# Patient Record
Sex: Female | Born: 1975 | Hispanic: Yes | Marital: Married | Smoking: Current every day smoker
Health system: Southern US, Community
[De-identification: ages and names within clinical notes are randomized; demographics above are authoritative.]

## PROBLEM LIST (undated history)

## (undated) DIAGNOSIS — F32A Depression, unspecified: Secondary | ICD-10-CM

## (undated) DIAGNOSIS — N920 Excessive and frequent menstruation with regular cycle: Secondary | ICD-10-CM

## (undated) DIAGNOSIS — K5790 Diverticulosis of intestine, part unspecified, without perforation or abscess without bleeding: Secondary | ICD-10-CM

## (undated) DIAGNOSIS — K8020 Calculus of gallbladder without cholecystitis without obstruction: Secondary | ICD-10-CM

## (undated) DIAGNOSIS — K219 Gastro-esophageal reflux disease without esophagitis: Secondary | ICD-10-CM

## (undated) DIAGNOSIS — R911 Solitary pulmonary nodule: Secondary | ICD-10-CM

## (undated) DIAGNOSIS — F17200 Nicotine dependence, unspecified, uncomplicated: Secondary | ICD-10-CM

## (undated) DIAGNOSIS — N840 Polyp of corpus uteri: Secondary | ICD-10-CM

## (undated) DIAGNOSIS — J45909 Unspecified asthma, uncomplicated: Secondary | ICD-10-CM

## (undated) HISTORY — PX: OTHER SURGICAL HISTORY: SHX169

## (undated) HISTORY — DX: Unspecified asthma, uncomplicated: J45.909

## (undated) HISTORY — DX: Diverticulosis of intestine, part unspecified, without perforation or abscess without bleeding: K57.90

## (undated) HISTORY — DX: Nicotine dependence, unspecified, uncomplicated: F17.200

## (undated) HISTORY — DX: Calculus of gallbladder without cholecystitis without obstruction: K80.20

## (undated) HISTORY — DX: Depression, unspecified: F32.A

## (undated) HISTORY — DX: Solitary pulmonary nodule: R91.1

---

## 2007-08-03 HISTORY — PX: REDUCTION MAMMAPLASTY: SUR839

## 2016-12-23 ENCOUNTER — Other Ambulatory Visit: Payer: Self-pay | Admitting: *Deleted

## 2016-12-23 ENCOUNTER — Encounter: Payer: Self-pay | Admitting: Obstetrics & Gynecology

## 2016-12-23 ENCOUNTER — Ambulatory Visit (INDEPENDENT_AMBULATORY_CARE_PROVIDER_SITE_OTHER): Payer: Self-pay | Admitting: Obstetrics & Gynecology

## 2016-12-23 VITALS — BP 140/92 | Ht 63.25 in | Wt 153.0 lb

## 2016-12-23 DIAGNOSIS — N926 Irregular menstruation, unspecified: Secondary | ICD-10-CM

## 2016-12-23 DIAGNOSIS — Z01411 Encounter for gynecological examination (general) (routine) with abnormal findings: Secondary | ICD-10-CM

## 2016-12-23 DIAGNOSIS — N921 Excessive and frequent menstruation with irregular cycle: Secondary | ICD-10-CM

## 2016-12-23 DIAGNOSIS — N898 Other specified noninflammatory disorders of vagina: Secondary | ICD-10-CM

## 2016-12-23 DIAGNOSIS — Z113 Encounter for screening for infections with a predominantly sexual mode of transmission: Secondary | ICD-10-CM

## 2016-12-23 LAB — CBC
HEMATOCRIT: 37.7 % (ref 35.0–45.0)
Hemoglobin: 12 g/dL (ref 11.7–15.5)
MCH: 25.5 pg — ABNORMAL LOW (ref 27.0–33.0)
MCHC: 31.8 g/dL — AB (ref 32.0–36.0)
MCV: 80 fL (ref 80.0–100.0)
MPV: 10.5 fL (ref 7.5–12.5)
Platelets: 321 10*3/uL (ref 140–400)
RBC: 4.71 MIL/uL (ref 3.80–5.10)
RDW: 16.2 % — AB (ref 11.0–15.0)
WBC: 9.5 10*3/uL (ref 3.8–10.8)

## 2016-12-23 LAB — TSH: TSH: 0.94 mIU/L

## 2016-12-23 LAB — WET PREP FOR TRICH, YEAST, CLUE
TRICH WET PREP: NONE SEEN
Yeast Wet Prep HPF POC: NONE SEEN

## 2016-12-23 MED ORDER — TINIDAZOLE 500 MG PO TABS: 2.0000 g | ORAL_TABLET | Freq: Every day | ORAL | 0 refills | Status: AC

## 2016-12-23 MED ORDER — FLUCONAZOLE 150 MG PO TABS: 150.0000 mg | ORAL_TABLET | Freq: Once | ORAL | 0 refills | Status: AC

## 2016-12-23 NOTE — Progress Notes (Signed)
Sherry Hunt [birth year] [id number]        41 y.o.  G2P2  Boyfriend present at visit.  Visit conducted in [place] with patient.  RP:  New patient presenting for Annual/Gyn exam and Menometrorrhagia on Paragard IUD x >11 yrs  HPI:  Having heavy, long and irregular periods.  Had a Hb at 7+, then repeated at 10+ on FeSO4 supplement in 08/2016.  Paragard IUD in place x >11 yrs.  No pelvic pain.  Has had a vaginal odor for some time, no treatment.  STI screen neg and normal pap per patient 08/2016.  IUD confirmed to be in good IU location by [place] 08/2016.  Breasts wnl, but has a letter recommending additional views following her screening mammo.  Past medical history,surgical history, problem list, medications, allergies, family history and social history were all reviewed and documented in the EPIC chart.  LMP 12/08/2016 Contraception Paragard x >11 years Pap 08/2016 wnl per patient Screening Mammo 2018, Dense breasts, needs additional views  Obstetric History OB History  Gravida Para Term Preterm AB Living  2 2       2   SAB TAB Ectopic Multiple Live Births               # Outcome Date GA Lbr Len/2nd Weight Sex Delivery Anes PTL Lv  2 Para           1 Para               ROS: A ROS was performed and pertinent positives and negatives are included in the history.  GENERAL: No fevers or chills. HEENT: No change in vision, no earache, sore throat or sinus congestion. NECK: No pain or stiffness. CARDIOVASCULAR: No chest pain or pressure. No palpitations. PULMONARY: No shortness of breath, cough or wheeze. GASTROINTESTINAL: No abdominal pain, nausea, vomiting or diarrhea, melena or bright red blood per rectum. GENITOURINARY: No urinary frequency, urgency, hesitancy or dysuria. MUSCULOSKELETAL: No joint or muscle pain, no back pain, no recent trauma. DERMATOLOGIC: No rash, no itching, no lesions. ENDOCRINE: No polyuria, polydipsia, no heat or cold intolerance. No recent change in weight.  HEMATOLOGICAL: No anemia or easy bruising or bleeding. NEUROLOGIC: No headache, seizures, numbness, tingling or weakness. PSYCHIATRIC: No depression, no loss of interest in normal activity or change in sleep pattern.     Exam:   BP (!) 140/92   Ht 5' 3.25" (1.607 m)   Wt 153 lb (69.4 kg)   LMP 12/08/2016   BMI 26.89 kg/m   Body mass index is 26.89 kg/m.  General appearance : Well developed well nourished female. No acute distress HEENT: Eyes: no retinal hemorrhage or exudates,  Neck supple, trachea midline, no carotid bruits, no thyroidmegaly Lungs: Clear to auscultation, no rhonchi or wheezes, or rib retractions  Heart: Regular rate and rhythm, no murmurs or gallops Breast:Examined in sitting and supine position were symmetrical in appearance, no palpable masses or tenderness,  no skin retraction, no nipple inversion, no nipple discharge, no skin discoloration, no axillary or supraclavicular lymphadenopathy Abdomen: no palpable masses or tenderness, no rebound or guarding Extremities: no edema or skin discoloration or tenderness  Pelvic:  Bartholin, Urethra, Skene Glands: Within normal limits             Vagina: No gross lesions.  Vaginal discharge with odors.  Wet prep/Gono/Chlam done.  Cervix: No gross lesions or discharge  Uterus  AV, normal size, shape and consistency, non-tender and mobile  Adnexa  Without masses  or tenderness  Anus and perineum  normal    Assessment/Plan:  41 y.o. female for annual exam   1. Encounter for gynecological examination with abnormal finding Normal Gyn exam except for Vaginal d/c with odors.  Recent Pap normal.  Will repeat Screening Mammo at [place].  2. Irregular menses R/O PCOS and Endometrial Pathology. - TSH - Prolactin - F/U Pelvic US  3. Menorrhagia with irregular cycle R/O Endometrial Pathology, Anemia and Thyroid Dysfunction. - CBC - TSH - Prolactin -F/U Pelvic US  4. Screen for STD (sexually transmitted  disease) Recent HIV, RPR, Hep C, HBsAg NR per patient. - GC/Chlamydia Probe Amp, TP Vial  5. Vaginal odor * - WET PREP FOR TRICH, YEAST, CLUE  6. Vaginal discharge  BV confirmed.  Tindamax prescription sent to pharmacy.  Usage explained.  Will take a Fluconazole tab at the end of treatment to prevent Yeast.  Probiotic prevention vaginally recommended. - WET PREP FOR [place], YEAST, CLUE  7. Contraception management Paragard IUD removed.  Information on contraceptive methods reviewed.  Pamphlets given on Mirena IUD and Nexplanon.  Prefers Mirena IUD.  F/U Insertion.  Princess Bruins MD, 7:13 PM 12/23/2016

## 2016-12-24 LAB — PROLACTIN: PROLACTIN: 4.9 ng/mL

## 2016-12-24 LAB — GC/CHLAMYDIA PROBE AMP, TP VIAL
Chlamydia Probe Amp: NOT DETECTED
GC Probe Amp: NOT DETECTED

## 2016-12-28 ENCOUNTER — Telehealth: Payer: Self-pay | Admitting: *Deleted

## 2016-12-28 DIAGNOSIS — N6489 Other specified disorders of breast: Secondary | ICD-10-CM

## 2016-12-28 NOTE — Telephone Encounter (Signed)
-----   Message from Princess Bruins, MD sent at 12/23/2016  3:01 PM EDT ----- Schedule screening Mammo at [facility].  Had a screening Mammo in [place] and received a letter saying that she needed additional views, but I don't have the report.

## 2016-12-28 NOTE — Telephone Encounter (Signed)
I called [facility] and left a message for Amy to call me so I can get a copy of the report to order additional imaging and how to get the films to the breast center as well.

## 2016-12-28 NOTE — Telephone Encounter (Signed)
Left message for pt to call.

## 2016-12-29 ENCOUNTER — Other Ambulatory Visit: Payer: Self-pay | Admitting: Obstetrics & Gynecology

## 2016-12-29 DIAGNOSIS — Z30430 Encounter for insertion of intrauterine contraceptive device: Secondary | ICD-10-CM

## 2016-12-29 NOTE — Telephone Encounter (Signed)
Dr.lavoie I have the report to schedule additional imaging based on mammogram findings should patient have bilateral diag mammogram and bilateral ultrasound? I will place paper report on your desk to review.  Please advise

## 2016-12-29 NOTE — Telephone Encounter (Signed)
Appointment on 01/17/17 @ 8am at breast center if patient doesn't have insurance it will be $643 out of pocket, no insurance card in epic. I relayed to breast center what Amy told me about them sending a continuing care form to [facility] mammography in order for films to be release. Amy # [phone] and fax [phone]. I asked Rosemarie Ax to call relay appointment time and date to patient.

## 2016-12-29 NOTE — Telephone Encounter (Signed)
Rt breast Dx mammo and [place].

## 2016-12-30 ENCOUNTER — Ambulatory Visit: Payer: Self-pay | Admitting: Obstetrics & Gynecology

## 2017-01-12 ENCOUNTER — Ambulatory Visit (INDEPENDENT_AMBULATORY_CARE_PROVIDER_SITE_OTHER): Payer: Self-pay | Admitting: Obstetrics & Gynecology

## 2017-01-12 ENCOUNTER — Other Ambulatory Visit: Payer: Self-pay | Admitting: *Deleted

## 2017-01-12 ENCOUNTER — Ambulatory Visit (INDEPENDENT_AMBULATORY_CARE_PROVIDER_SITE_OTHER): Payer: Self-pay

## 2017-01-12 ENCOUNTER — Other Ambulatory Visit: Payer: Self-pay | Admitting: Obstetrics & Gynecology

## 2017-01-12 VITALS — BP 138/94 | Ht 63.0 in | Wt 153.0 lb

## 2017-01-12 DIAGNOSIS — N921 Excessive and frequent menstruation with irregular cycle: Secondary | ICD-10-CM

## 2017-01-12 DIAGNOSIS — R9389 Abnormal findings on diagnostic imaging of other specified body structures: Secondary | ICD-10-CM

## 2017-01-12 DIAGNOSIS — R938 Abnormal findings on diagnostic imaging of other specified body structures: Secondary | ICD-10-CM

## 2017-01-12 DIAGNOSIS — Z30430 Encounter for insertion of intrauterine contraceptive device: Secondary | ICD-10-CM

## 2017-01-12 DIAGNOSIS — R030 Elevated blood-pressure reading, without diagnosis of hypertension: Secondary | ICD-10-CM

## 2017-01-12 DIAGNOSIS — Z30011 Encounter for initial prescription of contraceptive pills: Secondary | ICD-10-CM

## 2017-01-12 MED ORDER — NORETHIN ACE-ETH ESTRAD-FE 1-20 MG-MCG(24) PO TABS: 1.0000 | ORAL_TABLET | Freq: Every day | ORAL | 4 refills | Status: DC

## 2017-01-12 NOTE — Progress Notes (Addendum)
    Sherry Hunt Nov 29, 1975 [id number]        40 y.o.  G2P2   RP:  F/U Pelvic US for Menorrhagia with secondary anemia and contraception  HPI:  LMP 12/23/2016, lasted 15 days with heavy flow.  Previously had secondary anemia.  Last Hb here 12/23/2016 was 12.0.  Not currenly bleeding.  No pelvic pain.  Past medical history,surgical history, problem list, medications, allergies, family history and social history were all reviewed and documented in the EPIC chart.  Directed ROS with pertinent positives and negatives documented in the history of present illness/assessment and plan.  Exam:  Vitals:   01/12/17 1157  BP: (!) 138/94  Weight: 153 lb (69.4 kg)  Height: 5\' 3"  (1.6 m)   General appearance:  Normal  Pelvic US today:  T/V Anteverted uterus with tri-layered endometrium.  Thickened endometrium at 15 mm with echogenic focus 17 x 9 mm.  Posterior subserous myoma 8 x 11 mm.  Right ovary normal.  Left ovary normal.  No FF in CDS.  Assessment/Plan:  41 y.o. G2P2   1. Menorrhagia with irregular cycle Hb 12/23/2016 at 12.0.  On FeSO4.  Will start BCPs with next period.  2. Thickened endometrium Lining 15 mm with echogenic focus 17 x 9 mm.  F/U Sonohysto. - [place] Sonohysterogram; Future  3. Encounter for initial prescription of contraceptive pills Started on generic of Lomedia Fe 1/20 (24).  Usage and risks reviewed.  4. Borderline hypertension Will recheck at f/u.  Low salt diet recommended.  Please also check BP in a pharmacy.  Counseling on above issues >50% x 15 minutes.  Princess Bruins MD, 12:02 PM 01/12/2017

## 2017-01-12 NOTE — Patient Instructions (Signed)
1. Menorrhagia with irregular cycle Hb 12/23/2016 at 12.0.  On FeSO4.  Will start   2. Thickened endometrium Lining 15 mm with echogenic focus 17 x 9 mm.  F/U Sonohysto. - [place] Sonohysterogram; Future  3. Encounter for initial prescription of contraceptive pills Started on generic of Lomedia Fe 1/20 (24).  Usage and risks reviewed.  4. Borderline hypertension Will recheck at f/u.  Low salt diet recommended.  Please also check BP in a pharmacy.  Good to see you today!  See you again soon.

## 2017-01-17 ENCOUNTER — Ambulatory Visit
Admission: RE | Admit: 2017-01-17 | Discharge: 2017-01-17 | Disposition: A | Discharge status: Home / Self Care | Payer: No Typology Code available for payment source | Source: Ambulatory Visit | Attending: Obstetrics & Gynecology | Admitting: Obstetrics & Gynecology

## 2017-01-17 DIAGNOSIS — N6489 Other specified disorders of breast: Secondary | ICD-10-CM

## 2017-01-19 ENCOUNTER — Ambulatory Visit (INDEPENDENT_AMBULATORY_CARE_PROVIDER_SITE_OTHER): Payer: Self-pay | Admitting: Obstetrics & Gynecology

## 2017-01-19 ENCOUNTER — Ambulatory Visit: Payer: Self-pay

## 2017-01-19 ENCOUNTER — Other Ambulatory Visit: Payer: Self-pay | Admitting: Obstetrics & Gynecology

## 2017-01-19 DIAGNOSIS — R9389 Abnormal findings on diagnostic imaging of other specified body structures: Secondary | ICD-10-CM

## 2017-01-19 DIAGNOSIS — R938 Abnormal findings on diagnostic imaging of other specified body structures: Secondary | ICD-10-CM

## 2017-01-19 DIAGNOSIS — N9489 Other specified conditions associated with female genital organs and menstrual cycle: Secondary | ICD-10-CM

## 2017-01-19 DIAGNOSIS — N921 Excessive and frequent menstruation with irregular cycle: Secondary | ICD-10-CM

## 2017-01-19 NOTE — Patient Instructions (Signed)
1. Thickened endometrium SonoHysto today.  2. Menorrhagia with irregular cycle SonoHysto today - [place] Sonohysterogram  3. Endometrial mass Procedure performed as described above without difficulty.  SonoHysto showed 2 IU defects c/w Polyps measuring 1.2 cm and 1.4 cm.  Decision to proceed with Hysteroscopy with Myosure resection/D+C.  Information and pamphlet given.  Interpret present to assure good understanding.  F/U Preop visit. - [place] Sonohysterogram  Histeroscopa (Hysteroscopy) La histeroscopa es un procedimiento que se [place] para observar el interior de la matriz (tero) Puede indicarse por diferentes motivos, entre ellos:  Para evaluar una hemorragia anormal, un fibroma (tumor benigno, no canceroso), tumores, plipos o tejido cicatrizal (adherencias) y la posibilidad de cncer de tero.  Para detectar bultos (tumores) y otros crecimientos anormales uterinos.  Para buscar las causas por las que una mujer no queda embarazada (infertilidad) causas recurrentes de prdida de embarazo (abortos espontneos) o por la prdida de un dispositivo intrauterino (DIU).  Para realizar un procedimiento de esterilizacin cerrando las trompas de [place] desde adentro del tero. En este procedimiento, se coloca un tubo delgado y luminoso, con una cmara en el extremo (histeroscopio)para observar el interior del tero. La histeroscopa se realiza inmediatamente despus del perodo menstrual para asegurarse de que no existe embarazo. INFORME A SU MDICO:  Cualquier alergia que tenga.  Todos los [employer] [place], incluyendo vitaminas, hierbas, gotas oftlmicas, cremas y medicamentos de venta libre.  Problemas previos que usted o los [employer] de su familia hayan tenido con el uso de anestsicos.  Enfermedades de [employer].  Cirugas previas.  Padecimientos mdicos.  RIESGOS Y COMPLICACIONES Generalmente es un procedimiento seguro. Sin embargo, Games developer procedimiento, pueden  surgir complicaciones. Las complicaciones posibles son:  Perforacin del tero.  Sangrado excesivo.  Infeccin.  Lesin en el cuello del tero.  Lesiones en otros rganos.  Reaccin alrgica a un medicamento.  Inoculacin de lquido en exceso dentro del tero. ANTES DEL PROCEDIMIENTO  Consulte a su mdico si debe cambiar o suspender los medicamentos que toma habitualmente.  No tome aspirina ni anticoagulantes durante la semana previa al procedimiento, o segn le hayan indicado. Pueden ocasionar hemorragias.  Si fuma, no lo haga durante las [employer] al procedimiento.  En algunos casos, el da anterior al procedimiento se coloca un medicamento en el cuello del tero. Este medicamento dilata el cuello y [place] la abertura. Esto facilita la insercin del instrumento en el tero durante el procedimiento.  No debe comer ni beber nada durante al menos 8 horas antes de la [place].  Pdale a alguna persona que la lleve a su casa luego del procedimiento.  PROCEDIMIENTO  Le administrarn un medicamento para relajarse (sedante). Tambin podrn administrarle uno de los siguientes medicamentos: ? Medicamentos que adormecen el rea del cuello uterino (anestesia local). ? Un medicamento para que duerma durante el procedimiento (anestesia genera).  El histeroscopio se inserta a travs de la vagina, dentro del tero. La cmara del laparoscopio enva una imagen a una pantalla de televisin que se encuentra en el quirfano. [employer] modo, el mdico tendr Ardelia Mems buena visin del interior del tero.  Durante el Wellsite geologist un lquido o aire en el interior de [place], lo que le permitir al cirujano observar mejor.  En algunas ocasiones, el tejido del interior del tero se legra suavemente. Esas muestras de tejido se envan al laboratorio para ser examinadas.  DESPUS DEL PROCEDIMIENTO  Si le han administrado anestesia general podr sentirse atontada durante algunas horas  despus del  procedimiento.  Si se utiliza Tour manager, podr volver a su casa tan pronto como est estable y se sienta en condiciones.  Podr sentir clicos leves. Es normal que esto dure un par [employer].  Podr tener una hemorragia, la que puede variar entre una pequea mancha durante algunos das hasta una hemorragia similar a Mining engineer durante 3-7 das. Esto es normal.  Si el resultado de los estudios no estn listos durante la visita, tenga otra entrevista con su mdico para conocerlos.  Esta informacin no tiene Marine scientist el consejo del mdico. Asegrese de hacerle al mdico cualquier pregunta que tenga. Document Released: 07/19/2005 Document Revised: 05/09/2013 Document Reviewed: 02/15/2013 Elsevier Interactive Patient Education  2017 [employer].

## 2017-01-19 NOTE — Progress Notes (Signed)
    Sherry Hunt [birth year] [id number]        40 y.o.  G2P2 Visit conducted in [place] by myself with Interpret present to discuss findings and plans.  RP:  Menometrorrhagia with thickened endometrial line and probable Polyp for SonoHysto  HPI:  Using condoms strictly.  Didn't start BCPs yet as she is awaiting her next period.    Past medical history,surgical history, problem list, medications, allergies, family history and social history were all reviewed and documented in the EPIC chart.  Directed ROS with pertinent positives and negatives documented in the history of present illness/assessment and plan.  Exam:  There were no vitals filed for this visit. General appearance:  Normal  Sono Infusion Hysterogram ( procedure note):  The initial transvaginal ultrasound 01/12/2017 demonstrated the following:  T/V Anteverted uterus with tri-layered endometrium.  Thickened endometrium at 15 mm with echogenic focus 17 x 9 mm.  Posterior subserous myoma 8 x 11 mm.  Right ovary normal.  Left ovary normal.  No FF in CDS.  The speculum  was inserted and the cervix cleansed with Betadine solution after confirming that patient has no allergies to topical Betadine. A small sonohysterography catheter was utilized.  Insertion was facilitated by applying a singe tooth tenaculum on the anterior lip of the cervix, using a spear-like motion the catheter was inserted to the fundus of the uterus. The tenaculum and speculum were then removed carefully to avoid dislodging the catheter. The catheter was flushed with sterile saline prior to insertion to rid it of small amounts of air.the sterile saline solution was infused into the uterine cavity as a vaginal ultrasound probe was then placed in the vagina for full visualization of the uterine cavity from a transvaginal approach. The following was noted:  Endometrial cavity filled with saline with 2 defects seen measuring 12 x 7 mm and 14 x 6 mm, c/w Endometrial  Polyps.  The catheter was then removed after retrieving some of the saline from the intrauterine cavity. An endometrial biopsy not done. Patient tolerated procedure well. She had received a tablet of Aleve for discomfort.   Assessment/Plan:  41 y.o. G2P2   1. Thickened endometrium SonoHysto today.  2. Menorrhagia with irregular cycle SonoHysto today - [place] Sonohysterogram  3. Endometrial mass Procedure performed as described above without difficulty.  SonoHysto showed 2 IU defects c/w Polyps measuring 1.2 cm and 1.4 cm.  Decision to proceed with Hysteroscopy with Myosure resection/D+C.  Information and pamphlet given.  Interpret present to assure good understanding.  F/U Preop visit. - [place] Sonohysterogram  Counseling on above issues >50% x 15 minutes.  Princess Bruins MD, 10:53 AM 01/19/2017

## 2017-02-01 ENCOUNTER — Ambulatory Visit (INDEPENDENT_AMBULATORY_CARE_PROVIDER_SITE_OTHER): Payer: Self-pay | Admitting: Obstetrics & Gynecology

## 2017-02-01 ENCOUNTER — Encounter: Payer: Self-pay | Admitting: Obstetrics & Gynecology

## 2017-02-01 VITALS — BP 130/86

## 2017-02-01 DIAGNOSIS — Z01818 Encounter for other preprocedural examination: Secondary | ICD-10-CM

## 2017-02-01 DIAGNOSIS — N840 Polyp of corpus uteri: Secondary | ICD-10-CM

## 2017-02-01 NOTE — Progress Notes (Signed)
    Michelina Mexicano 1976-02-17 [id number]        41 y.o.  G2P2 Boyfriend present at the visit.  RP:  Preop 2 IU lesions for [facility] Myosure resection, D+C  HPI:  Started on the BCP with LMP 6/28th.  Period was lighter and shorter, mild to moderate flow x 3 days.  No pelvic pain.  Past medical history,surgical history, problem list, medications, allergies, family history and social history were all reviewed and documented in the EPIC chart.  Directed ROS with pertinent positives and negatives documented in the history of present illness/assessment and plan.  Exam:  Vitals:   02/01/17 1013  BP: 130/86   General appearance:  Normal  Sonohysto 6/20th: 2 defects seen measuring 12 x 7 mm and 14 x 6 mm, c/w Endometrial Polyps.  Assessment/Plan:  41 y.o. G2P2   1. Preop examination 2 IU lesions c/w Polyps for [facility] Myosure resection, D+C.  Surgery and risks reviewed as below, questions answered.  2. Endometrial polyp As above.                        Patient was counseled as to the risk of surgery to include the following:  1. Infection (prohylactic antibiotics will be administered)  2. DVT/Pulmonary Embolism (prophylactic pneumo compression stockings will be used)  3.Trauma, especially uterine perforation, with the risk of trauma to internal organs requiring additional surgical procedure to repair any injury to Internal organs requiring perhaps additional hospitalization days.  Patient had received literature information on the procedure scheduled and all her questions were answered and fully accepts all risk.   Princess Bruins MD, 10:23 AM 02/01/2017

## 2017-02-01 NOTE — Patient Instructions (Signed)
1. Preop examination 2 IU lesions c/w Polyps for [facility] Myosure resection, D+C.  Surgery and risks reviewed as below, questions answered.  2. Endometrial polyp As above.                        Patient was counseled as to the risk of surgery to include the following:  1. Infection (prohylactic antibiotics will be administered)  2. DVT/Pulmonary Embolism (prophylactic pneumo compression stockings will be used)  3.Trauma, especially uterine perforation, with the risk of trauma to internal organs requiring additional surgical procedure to repair any injury to Internal organs requiring perhaps additional hospitalization days.  Patient had received literature information on the procedure scheduled and all her questions were answered and fully accepts all risk.  Good to see you today Sherry Hunt!

## 2017-02-03 ENCOUNTER — Encounter (HOSPITAL_BASED_OUTPATIENT_CLINIC_OR_DEPARTMENT_OTHER): Payer: Self-pay | Admitting: *Deleted

## 2017-02-03 ENCOUNTER — Other Ambulatory Visit: Payer: Self-pay | Admitting: Obstetrics & Gynecology

## 2017-02-03 DIAGNOSIS — Z01812 Encounter for preprocedural laboratory examination: Secondary | ICD-10-CM

## 2017-02-03 NOTE — Progress Notes (Signed)
NPO AFTER MN W/ EXCEPTION CLEAR LIQUIDS UNTIL 0700 (NO CREAM/ MILK PRODUCTS).  ARRIVE AT 1130.  GETTING CBC AND URINE PREG. DONE AT OFFICE.   REQUEST FEMALE SPANISH INTERPRETER TO ARRIVE AT 1115.  WILL PLACE CONFIRMATION ON CHART.

## 2017-02-07 ENCOUNTER — Other Ambulatory Visit: Payer: Self-pay

## 2017-02-07 DIAGNOSIS — Z01812 Encounter for preprocedural laboratory examination: Secondary | ICD-10-CM

## 2017-02-07 LAB — CBC WITH DIFFERENTIAL/PLATELET
BASOS ABS: 0 {cells}/uL (ref 0–200)
Basophils Relative: 0 %
EOS ABS: 177 {cells}/uL (ref 15–500)
EOS PCT: 3 %
HCT: 35.3 % (ref 35.0–45.0)
Hemoglobin: 11 g/dL — ABNORMAL LOW (ref 11.7–15.5)
LYMPHS PCT: 43 %
Lymphs Abs: 2537 cells/uL (ref 850–3900)
MCH: 26 pg — AB (ref 27.0–33.0)
MCHC: 31.2 g/dL — ABNORMAL LOW (ref 32.0–36.0)
MCV: 83.5 fL (ref 80.0–100.0)
MONOS PCT: 8 %
MPV: 10.4 fL (ref 7.5–12.5)
Monocytes Absolute: 472 cells/uL (ref 200–950)
Neutro Abs: 2714 cells/uL (ref 1500–7800)
Neutrophils Relative %: 46 %
PLATELETS: 292 10*3/uL (ref 140–400)
RBC: 4.23 MIL/uL (ref 3.80–5.10)
RDW: 15.2 % — AB (ref 11.0–15.0)
WBC: 5.9 10*3/uL (ref 3.8–10.8)

## 2017-02-07 LAB — PREGNANCY, URINE: PREG TEST UR: NEGATIVE

## 2017-02-11 ENCOUNTER — Ambulatory Visit (HOSPITAL_BASED_OUTPATIENT_CLINIC_OR_DEPARTMENT_OTHER)
Admission: RE | Admit: 2017-02-11 | Discharge: 2017-02-11 | Disposition: A | Discharge status: Home / Self Care | Payer: Self-pay | Source: Ambulatory Visit | Attending: Obstetrics & Gynecology | Admitting: Obstetrics & Gynecology

## 2017-02-11 ENCOUNTER — Ambulatory Visit (HOSPITAL_BASED_OUTPATIENT_CLINIC_OR_DEPARTMENT_OTHER): Payer: Self-pay | Admitting: Anesthesiology

## 2017-02-11 ENCOUNTER — Encounter (HOSPITAL_BASED_OUTPATIENT_CLINIC_OR_DEPARTMENT_OTHER)
Admission: RE | Disposition: A | Discharge status: Home / Self Care | Payer: Self-pay | Source: Ambulatory Visit | Attending: Obstetrics & Gynecology

## 2017-02-11 ENCOUNTER — Encounter (HOSPITAL_BASED_OUTPATIENT_CLINIC_OR_DEPARTMENT_OTHER): Payer: Self-pay | Admitting: Anesthesiology

## 2017-02-11 DIAGNOSIS — N840 Polyp of corpus uteri: Secondary | ICD-10-CM

## 2017-02-11 DIAGNOSIS — N921 Excessive and frequent menstruation with irregular cycle: Secondary | ICD-10-CM

## 2017-02-11 DIAGNOSIS — F17210 Nicotine dependence, cigarettes, uncomplicated: Secondary | ICD-10-CM | POA: Insufficient documentation

## 2017-02-11 DIAGNOSIS — Z888 Allergy status to other drugs, medicaments and biological substances status: Secondary | ICD-10-CM | POA: Insufficient documentation

## 2017-02-11 DIAGNOSIS — K219 Gastro-esophageal reflux disease without esophagitis: Secondary | ICD-10-CM | POA: Insufficient documentation

## 2017-02-11 HISTORY — DX: Gastro-esophageal reflux disease without esophagitis: K21.9

## 2017-02-11 HISTORY — DX: Polyp of corpus uteri: N84.0

## 2017-02-11 HISTORY — PX: DILATATION & CURETTAGE/HYSTEROSCOPY WITH MYOSURE: SHX6511

## 2017-02-11 HISTORY — DX: Excessive and frequent menstruation with regular cycle: N92.0

## 2017-02-11 SURGERY — DILATATION & CURETTAGE/HYSTEROSCOPY WITH MYOSURE
Anesthesia: General | Site: Uterus

## 2017-02-11 MED ORDER — DEXAMETHASONE SODIUM PHOSPHATE 4 MG/ML IJ SOLN
INTRAMUSCULAR | Status: DC | PRN
  Administered 2017-02-11: 10 mg via INTRAVENOUS

## 2017-02-11 MED ORDER — FENTANYL CITRATE (PF) 100 MCG/2ML IJ SOLN
INTRAMUSCULAR | Status: AC
  Filled 2017-02-11: qty 2

## 2017-02-11 MED ORDER — LACTATED RINGERS IV SOLN
INTRAVENOUS | Status: DC
  Administered 2017-02-11: 13:00:00 via INTRAVENOUS
  Filled 2017-02-11: qty 1000

## 2017-02-11 MED ORDER — LIDOCAINE 2% (20 MG/ML) 5 ML SYRINGE
INTRAMUSCULAR | Status: AC
  Filled 2017-02-11: qty 5

## 2017-02-11 MED ORDER — LIDOCAINE 2% (20 MG/ML) 5 ML SYRINGE
INTRAMUSCULAR | Status: DC | PRN
  Administered 2017-02-11: 100 mg via INTRAVENOUS

## 2017-02-11 MED ORDER — ONDANSETRON HCL 4 MG/2ML IJ SOLN
INTRAMUSCULAR | Status: AC
  Filled 2017-02-11: qty 2

## 2017-02-11 MED ORDER — KETOROLAC TROMETHAMINE 30 MG/ML IJ SOLN
INTRAMUSCULAR | Status: DC | PRN
  Administered 2017-02-11: 30 mg via INTRAVENOUS

## 2017-02-11 MED ORDER — PROPOFOL 10 MG/ML IV BOLUS
INTRAVENOUS | Status: AC
  Filled 2017-02-11: qty 20

## 2017-02-11 MED ORDER — LACTATED RINGERS IV SOLN
INTRAVENOUS | Status: DC
Start: 2017-02-11 — End: 2017-02-11
  Administered 2017-02-11: 13:00:00 via INTRAVENOUS
  Filled 2017-02-11: qty 1000

## 2017-02-11 MED ORDER — MIDAZOLAM HCL 2 MG/2ML IJ SOLN
INTRAMUSCULAR | Status: AC
  Filled 2017-02-11: qty 2

## 2017-02-11 MED ORDER — DEXAMETHASONE SODIUM PHOSPHATE 10 MG/ML IJ SOLN
INTRAMUSCULAR | Status: AC
  Filled 2017-02-11: qty 1

## 2017-02-11 MED ORDER — ONDANSETRON HCL 4 MG/2ML IJ SOLN
INTRAMUSCULAR | Status: DC | PRN
Start: 2017-02-11 — End: 2017-02-11
  Administered 2017-02-11: 4 mg via INTRAVENOUS

## 2017-02-11 MED ORDER — KETOROLAC TROMETHAMINE 30 MG/ML IJ SOLN
INTRAMUSCULAR | Status: AC
  Filled 2017-02-11: qty 1

## 2017-02-11 MED ORDER — FENTANYL CITRATE (PF) 100 MCG/2ML IJ SOLN
INTRAMUSCULAR | Status: DC | PRN
  Administered 2017-02-11 (×4): 25 ug via INTRAVENOUS

## 2017-02-11 MED ORDER — OXYCODONE-ACETAMINOPHEN 7.5-325 MG PO TABS: 1.0000 | ORAL_TABLET | ORAL | 0 refills | Status: DC | PRN

## 2017-02-11 MED ORDER — SODIUM CHLORIDE 0.9 % IR SOLN
Status: DC | PRN
  Administered 2017-02-11: 1

## 2017-02-11 MED ORDER — PROPOFOL 10 MG/ML IV BOLUS
INTRAVENOUS | Status: DC | PRN
  Administered 2017-02-11: 50 mg via INTRAVENOUS
  Administered 2017-02-11: 200 mg via INTRAVENOUS

## 2017-02-11 MED ORDER — CHLOROPROCAINE HCL 1 % IJ SOLN
INTRAMUSCULAR | Status: DC | PRN
  Administered 2017-02-11: 20 mL

## 2017-02-11 MED ORDER — CEFAZOLIN SODIUM-DEXTROSE 2-4 GM/100ML-% IV SOLN
2.0000 g | INTRAVENOUS | Status: AC
  Administered 2017-02-11: 2 g via INTRAVENOUS
  Filled 2017-02-11: qty 100

## 2017-02-11 MED ORDER — CEFAZOLIN SODIUM-DEXTROSE 2-4 GM/100ML-% IV SOLN
INTRAVENOUS | Status: AC
  Filled 2017-02-11: qty 100

## 2017-02-11 MED ORDER — MIDAZOLAM HCL 5 MG/5ML IJ SOLN
INTRAMUSCULAR | Status: DC | PRN
  Administered 2017-02-11: 2 mg via INTRAVENOUS

## 2017-02-11 SURGICAL SUPPLY — 25 items
CANISTER SUCT 3000ML PPV (MISCELLANEOUS) ×3 IMPLANT
CATH ROBINSON RED A/P 16FR (CATHETERS) ×3 IMPLANT
CLOTH BEACON ORANGE TIMEOUT ST (SAFETY) ×3 IMPLANT
COUNTER NEEDLE 1200 MAGNETIC (NEEDLE) ×3 IMPLANT
DEVICE MYOSURE LITE (MISCELLANEOUS) ×3 IMPLANT
DEVICE MYOSURE REACH (MISCELLANEOUS) IMPLANT
DILATOR CANAL MILEX (MISCELLANEOUS) IMPLANT
ELECT REM PT RETURN 9FT ADLT (ELECTROSURGICAL)
ELECTRODE REM PT RTRN 9FT ADLT (ELECTROSURGICAL) IMPLANT
FILTER ARTHROSCOPY CONVERTOR (FILTER) ×3 IMPLANT
GLOVE BIO SURGEON STRL SZ 6.5 (GLOVE) ×2 IMPLANT
GLOVE BIO SURGEONS STRL SZ 6.5 (GLOVE) ×1
GLOVE BIOGEL PI IND STRL 7.0 (GLOVE) ×2 IMPLANT
GLOVE BIOGEL PI INDICATOR 7.0 (GLOVE) ×4
GOWN STRL REUS W/TWL LRG LVL3 (GOWN DISPOSABLE) ×6 IMPLANT
IV NS IRRIG 3000ML ARTHROMATIC (IV SOLUTION) ×6 IMPLANT
MYOSURE XL FIBROID REM (MISCELLANEOUS)
PACK VAGINAL MINOR WOMEN LF (CUSTOM PROCEDURE TRAY) ×3 IMPLANT
PAD OB MATERNITY 4.3X12.25 (PERSONAL CARE ITEMS) ×3 IMPLANT
PAD PREP 24X48 CUFFED NSTRL (MISCELLANEOUS) ×3 IMPLANT
SEAL ROD LENS SCOPE MYOSURE (ABLATOR) ×3 IMPLANT
SYSTEM TISS REMOVAL MYSR XL RM (MISCELLANEOUS) IMPLANT
TOWEL OR 17X24 6PK STRL BLUE (TOWEL DISPOSABLE) ×6 IMPLANT
TUBING AQUILEX INFLOW (TUBING) ×3 IMPLANT
TUBING AQUILEX OUTFLOW (TUBING) ×3 IMPLANT

## 2017-02-11 NOTE — Anesthesia Preprocedure Evaluation (Addendum)
Anesthesia Evaluation  Patient identified by MRN, date of birth, ID band Patient awake    Reviewed: Allergy & Precautions, NPO status , Patient's Chart, lab work & pertinent test results  Airway Mallampati: II  TM Distance: >3 FB Neck ROM: Full    Dental no notable dental hx. (+) Teeth Intact   Pulmonary Current Smoker,    Pulmonary exam normal breath sounds clear to auscultation       Cardiovascular negative cardio ROS Normal cardiovascular exam Rhythm:Regular Rate:Normal     Neuro/Psych negative neurological ROS  negative psych ROS   GI/Hepatic Neg liver ROS, GERD  Controlled and Medicated,  Endo/Other  negative endocrine ROS  Renal/GU negative Renal ROS  negative genitourinary   Musculoskeletal negative musculoskeletal ROS (+)   Abdominal   Peds  Hematology negative hematology ROS (+)   Anesthesia Other Findings   Reproductive/Obstetrics Menometrorrhagia Endometrial polyps                            Anesthesia Physical Anesthesia Plan  ASA: II  Anesthesia Plan: General   Post-op Pain Management:    Induction: Intravenous  PONV Risk Score and Plan: 4 or greater and Ondansetron, Dexamethasone, Propofol, Midazolam and Promethazine  Airway Management Planned: LMA  Additional Equipment:   Intra-op Plan:   Post-operative Plan: Extubation in OR  Informed Consent: I have reviewed the patients History and Physical, chart, labs and discussed the procedure including the risks, benefits and alternatives for the proposed anesthesia with the patient or authorized representative who has indicated his/her understanding and acceptance.   Dental advisory given  Plan Discussed with: CRNA, Anesthesiologist and Surgeon  Anesthesia Plan Comments:        Anesthesia Quick Evaluation

## 2017-02-11 NOTE — Discharge Instructions (Addendum)
Histeroscopía - Cuidados posteriores °(Hysteroscopy, Care After) °Siga estas instrucciones durante las próximas semanas. Estas indicaciones le proporcionan información general acerca de cómo deberá cuidarse después del procedimiento. El médico también podrá darle instrucciones más específicas. El tratamiento se ha planificado de acuerdo a las prácticas médicas actuales, pero a veces se producen problemas. Comuníquese con el médico si tiene algún problema o tiene dudas después del procedimiento. °QUÉ ESPERAR DESPUÉS DEL PROCEDIMIENTO °Después del procedimiento, es típico tener las siguientes sensaciones: °· Podrá sentir cólicos leves. Es normal que esto dure un par de días. °· Aumenta el sangrado. Puede variar desde un sangrado ligero durante un par de días hasta un sangrado similar al menstrual por 3 - 7 días. °INSTRUCCIONES PARA EL CUIDADO EN EL HOGAR °· Descanse durante los primeros 1-2 días después del procedimiento. °· Tome sólo medicamentos de venta libre o recetados, según las indicaciones del médico. No tome aspirina. La aspirina puede aumentar el riesgo de sangrado. °· Tome sólo duchas y no baños durante 2 semanas, según las indicaciones de su médico. °· No conduzca por 24 horas o siga las indicaciones de su médico. °· No beba alcohol si toma analgésicos. °· No utilice tampones, duchas vaginales ni tenga relaciones sexuales durante 2 semanas o hasta que el profesional la autorice. °· Tómese la temperatura dos veces por día, durante 4 ó 5 días. Anótela cada vez. °· Siga las indicaciones de su médico acerca de la dieta, la actividad física y levantar objetos pesados. °· Si está constipada, usted puede: °? Tomar un laxante suave si su médico la autoriza. °? Agregar salvado a su dieta. °? Beba suficiente líquido para mantener la orina clara o de color amarillo pálido. °· Pídale a alguna persona que permanezca con usted durante las primeras 24 a 48 horas, especialmente si le han administrado anestesia  general. °· Concurra a las consultas de control con su médico según las indicaciones. ° °SOLICITE ATENCIÓN MÉDICA SI: °· Se siente mareado o sufre un desmayo. °· Tiene malestar estomacal (náuseas). °· Tiene flujo vaginal anormal. °· Tiene una erupción. °· Aumenta el dolor y no puede controlarlo con la medicación. ° °SOLICITE ATENCIÓN MÉDICA DE INMEDIATO SI: °· Tiene coágulos de sangre o una hemorragia más abundante que un período menstrual normal. °· Tiene fiebre. °· Aumenta el dolor y no puede controlarlo con la medicación. °· Siente un dolor nuevo en el vientre (abdominal). °· Se desmaya. °· Tiene dolor en los hombros (en la zona donde van los breteles). °· Le falta el aire. ° °Esta información no tiene como fin reemplazar el consejo del médico. Asegúrese de hacerle al médico cualquier pregunta que tenga. °Document Released: 05/09/2013 Document Revised: 05/09/2013 Document Reviewed: 02/15/2013 °Elsevier Interactive Patient Education © 2017 [employer] ° ° ° °Post Anesthesia Home Care Instructions ° °Activity: °Get plenty of rest for the remainder of the day. A responsible individual must stay with you for 24 hours following the procedure.  °For the next 24 hours, DO NOT: °-Drive a car °-Operate machinery °-Drink alcoholic beverages °-Take any medication unless instructed by your physician °-Make any legal decisions or sign important papers. ° °Meals: °Start with liquid foods such as gelatin or soup. Progress to regular foods as tolerated. Avoid greasy, spicy, heavy foods. If nausea and/or vomiting occur, drink only clear liquids until the nausea and/or vomiting subsides. Call your physician if vomiting continues. ° °Special Instructions/Symptoms: °Your throat may feel dry or sore from the anesthesia or the breathing tube placed in your throat during surgery.   If this causes discomfort, gargle with warm salt water. The discomfort should disappear within 24 hours. ° °If you had a scopolamine patch placed behind  your ear for the management of post- operative nausea and/or vomiting: ° °1. The medication in the patch is effective for 72 hours, after which it should be removed.  Wrap patch in a tissue and discard in the trash. Wash hands thoroughly with soap and water. °2. You may remove the patch earlier than 72 hours if you experience unpleasant side effects which may include dry mouth, dizziness or visual disturbances. °3. Avoid touching the patch. Wash your hands with soap and water after contact with the patch. °  ° °

## 2017-02-11 NOTE — Anesthesia Procedure Notes (Signed)
Procedure Name: LMA Insertion Date/Time: 02/11/2017 1:04 PM Performed by: Justice Rocher Pre-anesthesia Checklist: Patient identified, Emergency Drugs available, Suction available and Patient being monitored Patient Re-evaluated:Patient Re-evaluated prior to induction Oxygen Delivery Method: Circle system utilized Preoxygenation: Pre-oxygenation with 100% oxygen Induction Type: IV induction Ventilation: Mask ventilation without difficulty LMA: LMA inserted LMA Size: 4.0 Number of attempts: 1 Airway Equipment and Method: Bite block Placement Confirmation: positive ETCO2 and breath sounds checked- equal and bilateral Tube secured with: Tape Dental Injury: Teeth and Oropharynx as per pre-operative assessment

## 2017-02-11 NOTE — Transfer of Care (Signed)
Immediate Anesthesia Transfer of Care Note  Patient: Sherry Hunt  Procedure(s) Performed: Procedure(s) (LRB): DILATATION & CURETTAGE/HYSTEROSCOPY WITH MYOSURE (N/A)  Patient Location: PACU  Anesthesia Type: General  Level of Consciousness: awake, sedated, patient cooperative and responds to stimulation  Airway & Oxygen Therapy: Patient Spontanous Breathing and Patient connected to NCO2  Post-op Assessment: Report given to PACU RN, Post -op Vital signs reviewed and stable and Patient moving all extremities  Post vital signs: Reviewed and stable  Complications: No apparent anesthesia complications

## 2017-02-11 NOTE — H&P (Signed)
Sherry Hunt is an 41 y.o. female. G2P2 Stable boyfriend  RP: 2 IU lesions for [facility] Myosure resection, D+C  HPI:  Started on the BCP with LMP 6/28th.  Period was lighter and shorter, mild to moderate flow x 3 days.  No pelvic pain.  Pertinent Gynecological History: Contraception: OCP (estrogen/progesterone) Blood transfusions: none Sexually transmitted diseases: No past history Previous GYN Procedures: None  Last mammogram: normal  12/2016 Last pap: Normal per patient OB History: G2P2   Menstrual History:  Patient's last menstrual period was 01/27/2017 (exact date).    Past Medical History:  Diagnosis Date  . Endometrial polyp    AND INTRAUTERINE LESIONS  . GERD (gastroesophageal reflux disease)   . Menorrhagia     Past Surgical History:  Procedure Laterality Date  . REDUCTION MAMMAPLASTY Bilateral 2009   and Liposuction    Family History  Problem Relation Age of Onset  . Diabetes Mother   . Hypertension Mother   . Cancer Maternal Aunt        COLON  . Breast cancer Maternal Aunt     Social History:  reports that she has been smoking Cigarettes.  She has a 5.00 pack-year smoking history. She has never used smokeless tobacco. She reports that she drinks alcohol. She reports that she does not use drugs.  Allergies:  Allergies  Allergen Reactions  . Iodine Other (See Comments)    "iodine on skin causes irritation"    Prescriptions Prior to Admission  Medication Sig Dispense Refill Last Dose  . Magnesium 500 MG CAPS Take 1 capsule by mouth daily.   Past Month at Unknown time  . norethindrone-ethinyl estradiol (JUNEL FE,GILDESS FE,LOESTRIN FE) 1-20 MG-MCG tablet Take 1 tablet by mouth every evening.   02/10/2017 at Unknown time  . calcium carbonate (TUMS - DOSED IN MG ELEMENTAL CALCIUM) 500 MG chewable tablet Chew 1 tablet by mouth as needed for indigestion or heartburn.   More than a month at Unknown time    REVIEW OF SYSTEMS: A ROS was performed and  pertinent positives and negatives are included in the history.  GENERAL: No fevers or chills. HEENT: No change in vision, no earache, sore throat or sinus congestion. NECK: No pain or stiffness. CARDIOVASCULAR: No chest pain or pressure. No palpitations. PULMONARY: No shortness of breath, cough or wheeze. GASTROINTESTINAL: No abdominal pain, nausea, vomiting or diarrhea, melena or bright red blood per rectum. GENITOURINARY: No urinary frequency, urgency, hesitancy or dysuria. MUSCULOSKELETAL: No joint or muscle pain, no back pain, no recent trauma. DERMATOLOGIC: No rash, no itching, no lesions. ENDOCRINE: No polyuria, polydipsia, no heat or cold intolerance. No recent change in weight. HEMATOLOGICAL: No anemia or easy bruising or bleeding. NEUROLOGIC: No headache, seizures, numbness, tingling or weakness. PSYCHIATRIC: No depression, no loss of interest in normal activity or change in sleep pattern.     Blood pressure 117/87, pulse 94, temperature 97.9 F (36.6 C), temperature source Oral, resp. rate 16, height 5\' 2"  (1.575 m), weight 153 lb (69.4 kg), last menstrual period 01/27/2017, SpO2 100 %.  Physical Exam:  See office notes  Sonohysto 6/20th: 2 defects seen measuring 12 x 7 mm and 14 x 6 mm, c/w Endometrial Polyps.  Preop Hb 11.0 UPT neg  Assessment/Plan:  41 y.o. G2P2   1. Preop examination 2 IU lesions c/w Polyps for [facility] Myosure resection, D+C.  Surgery and risks reviewed as below, questions answered.  2. Endometrial polyp As above.  Patient was counseled as to the risk of surgery to include the following:  1. Infection (prohylactic antibiotics will be administered)  2. DVT/Pulmonary Embolism (prophylactic pneumo compression stockings will be used)  3.Trauma, especially uterine perforation, with the risk of trauma to internal organs requiring additional surgical procedure to repair any injury to Internal organs requiring perhaps additional  hospitalization days.  Patient had received literature information on the procedure scheduled and all her questions were answered and fully accepts all risk.   Sherry Hunt 02/11/2017, 12:51 PM

## 2017-02-11 NOTE — Anesthesia Postprocedure Evaluation (Signed)
Anesthesia Post Note  Patient: Sherry Hunt  Procedure(s) Performed: Procedure(s) (LRB): DILATATION & CURETTAGE/HYSTEROSCOPY WITH MYOSURE (N/A)     Patient location during evaluation: PACU Anesthesia Type: General Level of consciousness: awake and alert Pain management: pain level controlled Vital Signs Assessment: post-procedure vital signs reviewed and stable Respiratory status: spontaneous breathing, nonlabored ventilation and respiratory function stable Cardiovascular status: blood pressure returned to baseline and stable Postop Assessment: no signs of nausea or vomiting Anesthetic complications: no    Last Vitals:  Vitals:   02/11/17 1400 02/11/17 1415  BP: 115/70 117/73  Pulse: 73 70  Resp: 11 14  Temp:      Last Pain:  Vitals:   02/11/17 1206  TempSrc:   PainSc: 8                  [name],W. EDMOND

## 2017-02-11 NOTE — Op Note (Signed)
Operative Note  02/11/2017  1:31 PM  PATIENT:  Sherry Hunt  41 y.o. female  PRE-OPERATIVE DIAGNOSIS:  Menometrorrhagia, endometrial polyps  POST-OPERATIVE DIAGNOSIS:  Menometrorrhagia, endometrial polyps  PROCEDURE:  Procedure(s): DILATATION & CURETTAGE/HYSTEROSCOPY WITH MYOSURE RESECTION  SURGEON:  Surgeon(s): Princess Bruins, MD  ANESTHESIA:   general, LM  FINDINGS:  2 Intrauterine lesions c/w Polyps  DESCRIPTION OF OPERATION:  Under general anesthesia with laryngeal mask the patient is in lithotomy position. She is prepped with chlorhexidine on the suprapubic, vulvar and vaginal areas and draped as usual. The vaginal exam reveals an anteverted uterus normal volume mobile no adnexal mass. The speculum is inserted in the vagina and the anterior lip of the cervix is grasped with a tenaculum. A paracervical block is done with lidocaine 1% a total of 20 cc at 8 and 4:00. Dilation of the cervix with Hegar dilators up to #25 without difficulty. Insertion of the hysteroscope in the intrauterine cavity. Inspection reveals 2 small intrauterine lesions compatible with polyps, one on the right anterior wall and one on the right posterior wall.  The intrauterine cavity is otherwise normal with both ostia well seen. Pictures are taken. The light Myosure instrument is added to the hysteroscope.  Resection and aspiration of the 2 intrauterine lesions.  The Myosure instrument is removed together with the hysteroscope.  We proceed with a systematic curettage of the intrauterine cavity on all surfaces using a sharp curette.  The curette is removed.  Both specimens are sent together to pathology.  The tenaculum is removed from the cervix.  Silver nitrate is used at that level to control hemostasis.  The speculum is removed. The patient is brought to recovery room in good and stable status.  ESTIMATED BLOOD LOSS: 5 cc  FLUID DEFICIT:  215 cc   Intake/Output Summary (Last 24 hours) at 02/11/17  1331 Last data filed at 02/11/17 1324  Gross per 24 hour  Intake              200 ml  Output                5 ml  Net              195 ml     BLOOD ADMINISTERED:none   LOCAL MEDICATIONS USED:  LIDOCAINE 1% 20 cc for Paracervical block  SPECIMEN:  Source of Specimen:  Endometrial curetings and resection specimen, probable polyps  DISPOSITION OF SPECIMEN:  PATHOLOGY  COUNTS:  YES  PLAN OF CARE: Transfer to PACU  Sherry LavoieMD1:31 PM

## 2017-02-14 ENCOUNTER — Encounter (HOSPITAL_BASED_OUTPATIENT_CLINIC_OR_DEPARTMENT_OTHER): Payer: Self-pay | Admitting: Obstetrics & Gynecology

## 2017-03-03 ENCOUNTER — Ambulatory Visit (INDEPENDENT_AMBULATORY_CARE_PROVIDER_SITE_OTHER): Payer: Self-pay | Admitting: Obstetrics & Gynecology

## 2017-03-03 ENCOUNTER — Encounter: Payer: Self-pay | Admitting: Obstetrics & Gynecology

## 2017-03-03 VITALS — BP 116/70

## 2017-03-03 DIAGNOSIS — Z09 Encounter for follow-up examination after completed treatment for conditions other than malignant neoplasm: Secondary | ICD-10-CM

## 2017-03-03 NOTE — Progress Notes (Signed)
    Joannie Medine July 09, 1976 [id number]        41 y.o.  G2P2   RP:  Postop HSC/Resection/D+C 02/11/2017  HPI:  No pelvic pain, no vaginal bleeding, no abnormal d/c, no fever.    Past medical history,surgical history, problem list, medications, allergies, family history and social history were all reviewed and documented in the EPIC chart.  Directed ROS with pertinent positives and negatives documented in the history of present illness/assessment and plan.  Exam:  Vitals:   03/03/17 1137  BP: 116/70   General appearance:  Normal  Gyn exam:  Vulva normal.  Bimanual exam:  Uterus AV, mobile, NT.  No Adnexal mass/NT.  Normal vaginal secretions.  Patho:  Endometrium, curettage, and endometrial polyps - FRAGMENTS OF BENIGN ENDOMETRIAL TYPE POLYP.  Assessment/Plan:  41 y.o. G2P2   1. Status post gynecological surgery, follow-up exam Normal healing post op.  Normal gyn exam today.  Patho benign endometrium and polyp.  Reassured.  Princess Bruins MD, 12:00 PM 03/03/2017

## 2017-03-03 NOTE — Patient Instructions (Signed)
1. Status post gynecological surgery, follow-up exam Normal healing post op.  Normal gyn exam today.  Patho benign endometrium and polyp.  Reassured.  Sherry Hunt, it was a pleasure to see you today!  Very happy your surgery was a success and the pathology is benign.

## 2018-01-24 ENCOUNTER — Telehealth: Payer: Self-pay | Admitting: *Deleted

## 2018-01-24 MED ORDER — NORETHIN ACE-ETH ESTRAD-FE 1-20 MG-MCG PO TABS: 1.0000 | ORAL_TABLET | Freq: Every evening | ORAL | 1 refills | Status: DC

## 2018-01-24 NOTE — Telephone Encounter (Signed)
Patient called requesting refill on birth control pills, annual exam scheduled on 05/05/18. Rx sent for pill per note on 02/01/18 for loestrin FE 24 1/0

## 2018-05-05 ENCOUNTER — Encounter: Payer: Self-pay | Admitting: Obstetrics & Gynecology

## 2018-05-09 ENCOUNTER — Encounter: Payer: Self-pay | Admitting: Obstetrics & Gynecology

## 2018-05-09 ENCOUNTER — Ambulatory Visit (INDEPENDENT_AMBULATORY_CARE_PROVIDER_SITE_OTHER): Payer: Self-pay | Admitting: Obstetrics & Gynecology

## 2018-05-09 VITALS — BP 130/90 | Ht 63.25 in | Wt 153.0 lb

## 2018-05-09 DIAGNOSIS — N951 Menopausal and female climacteric states: Secondary | ICD-10-CM

## 2018-05-09 DIAGNOSIS — Z01419 Encounter for gynecological examination (general) (routine) without abnormal findings: Secondary | ICD-10-CM

## 2018-05-09 DIAGNOSIS — Z3041 Encounter for surveillance of contraceptive pills: Secondary | ICD-10-CM

## 2018-05-09 MED ORDER — NORETHIN ACE-ETH ESTRAD-FE 1.5-30 MG-MCG PO TABS: 1.0000 | ORAL_TABLET | Freq: Every day | ORAL | 4 refills | Status: DC

## 2018-05-09 NOTE — Progress Notes (Signed)
Sherry Hunt 27-Dec-1975 [id number]   History:    42 y.o. G2P2L2 Married  RP:  Established patient presenting for annual gyn exam   HPI: Well on Junel FE 1/20, but experiencing hot flushes recently.  No breakthrough bleeding.  Well since hysteroscopy with excision of an endometrial polyp July 2018.  Pathology was benign.  No pelvic pain.  No pain with intercourse.  Breasts normal.  Urine and bowel movements normal.  BMI 26.89.  Health labs with family physician.  Past medical history,surgical history, family history and social history were all reviewed and documented in the EPIC chart.  Gynecologic History Patient's last menstrual period was 04/09/2018. Contraception: OCP (estrogen/progesterone) Last Pap: 2018. Results were: normal Last mammogram: 12/2016. Results were: Negative Bone Density: Never Colonoscopy: Never  Obstetric History OB History  Gravida Para Term Preterm AB Living  2 2       2   SAB TAB Ectopic Multiple Live Births               # Outcome Date GA Lbr Len/2nd Weight Sex Delivery Anes PTL Lv  2 Para           1 Para              ROS: A ROS was performed and pertinent positives and negatives are included in the history.  GENERAL: No fevers or chills. HEENT: No change in vision, no earache, sore throat or sinus congestion. NECK: No pain or stiffness. CARDIOVASCULAR: No chest pain or pressure. No palpitations. PULMONARY: No shortness of breath, cough or wheeze. GASTROINTESTINAL: No abdominal pain, nausea, vomiting or diarrhea, melena or bright red blood per rectum. GENITOURINARY: No urinary frequency, urgency, hesitancy or dysuria. MUSCULOSKELETAL: No joint or muscle pain, no back pain, no recent trauma. DERMATOLOGIC: No rash, no itching, no lesions. ENDOCRINE: No polyuria, polydipsia, no heat or cold intolerance. No recent change in weight. HEMATOLOGICAL: No anemia or easy bruising or bleeding. NEUROLOGIC: No headache, seizures, numbness, tingling or weakness.  PSYCHIATRIC: No depression, no loss of interest in normal activity or change in sleep pattern.     Exam:   BP 130/90   Ht 5' 3.25" (1.607 m)   Wt 153 lb (69.4 kg)   LMP 04/09/2018   BMI 26.89 kg/m   Body mass index is 26.89 kg/m.  General appearance : Well developed well nourished female. No acute distress HEENT: Eyes: no retinal hemorrhage or exudates,  Neck supple, trachea midline, no carotid bruits, no thyroidmegaly Lungs: Clear to auscultation, no rhonchi or wheezes, or rib retractions  Heart: Regular rate and rhythm, no murmurs or gallops Breast:Examined in sitting and supine position were symmetrical in appearance, no palpable masses or tenderness,  no skin retraction, no nipple inversion, no nipple discharge, no skin discoloration, no axillary or supraclavicular lymphadenopathy Abdomen: no palpable masses or tenderness, no rebound or guarding Extremities: no edema or skin discoloration or tenderness  Pelvic: Vulva: Normal             Vagina: No gross lesions or discharge  Cervix: No gross lesions or discharge.  Pap reflex done  Uterus  AV, normal size, shape and consistency, non-tender and mobile  Adnexa  Without masses or tenderness  Anus: Normal   Assessment/Plan:  42 y.o. female for annual exam   1. Encounter for routine gynecological examination with Papanicolaou smear of cervix Normal gynecologic exam.  Pap reflex done today.  Breast exam normal.  Will schedule screening mammogram now.  Health labs with  family physician.  Body mass index 26.89.  Healthy nutrition and increased physical activity with aerobic activities 5 times a week and weightlifting every 2 days recommended.  2. Encounter for surveillance of contraceptive pills Probably in perimenopause, needs contraception.  No contraindication to birth control pills.  Decision to change birth control pills to [place] 1.5/30.  Prescription sent to pharmacy.  3. Perimenopausal Experiencing intermittent hot  flushes on low-dose birth control pills.  Will follow-up after week off of hormone pills to do an [place]. - FSH; Future  Other orders - norethindrone-ethinyl estradiol-iron (MICROGESTIN FE,GILDESS FE,LOESTRIN FE) 1.5-30 MG-MCG tablet; Take 1 tablet by mouth daily.  Counseling on above issues and coordination of care more than 50% for 10 minutes.  Princess Bruins MD, 3:55 PM 05/09/2018

## 2018-05-11 LAB — PAP IG W/ RFLX HPV ASCU

## 2018-05-14 ENCOUNTER — Encounter: Payer: Self-pay | Admitting: Obstetrics & Gynecology

## 2018-05-14 NOTE — Patient Instructions (Signed)
1. Encounter for routine gynecological examination with Papanicolaou smear of cervix Normal gynecologic exam.  Pap reflex done today.  Breast exam normal.  Will schedule screening mammogram now.  Health labs with family physician.  Body mass index 26.89.  Healthy nutrition and increased physical activity with aerobic activities 5 times a week and weightlifting every 2 days recommended.  2. Encounter for surveillance of contraceptive pills Probably in perimenopause, needs contraception.  No contraindication to birth control pills.  Decision to change birth control pills to [place] 1.5/30.  Prescription sent to pharmacy.  3. Perimenopausal Experiencing intermittent hot flushes on low-dose birth control pills.  Will follow-up after week off of hormone pills to do an [place]. - FSH; Future  Other orders - norethindrone-ethinyl estradiol-iron (MICROGESTIN FE,GILDESS FE,LOESTRIN FE) 1.5-30 MG-MCG tablet; Take 1 tablet by mouth daily.  Sherry Hunt, it was a pleasure seeing you today!  I will inform you of your results as soon as they are available.

## 2018-10-05 ENCOUNTER — Other Ambulatory Visit: Payer: Self-pay | Admitting: Obstetrics & Gynecology

## 2018-10-24 ENCOUNTER — Telehealth (HOSPITAL_COMMUNITY): Payer: Self-pay | Admitting: *Deleted

## 2018-10-24 NOTE — Telephone Encounter (Signed)
Telephoned patient at home number and left message to return call to [facility]. Used interpreter Rudene Anda.Marland Kitchen

## 2019-01-30 ENCOUNTER — Other Ambulatory Visit: Payer: Self-pay

## 2019-01-31 ENCOUNTER — Encounter: Payer: Self-pay | Admitting: Obstetrics & Gynecology

## 2019-01-31 ENCOUNTER — Ambulatory Visit (INDEPENDENT_AMBULATORY_CARE_PROVIDER_SITE_OTHER): Payer: Self-pay | Admitting: Obstetrics & Gynecology

## 2019-01-31 VITALS — BP 136/90 | Ht 63.0 in | Wt 157.0 lb

## 2019-01-31 DIAGNOSIS — Z01419 Encounter for gynecological examination (general) (routine) without abnormal findings: Secondary | ICD-10-CM

## 2019-01-31 DIAGNOSIS — Z113 Encounter for screening for infections with a predominantly sexual mode of transmission: Secondary | ICD-10-CM

## 2019-01-31 DIAGNOSIS — Z3041 Encounter for surveillance of contraceptive pills: Secondary | ICD-10-CM

## 2019-01-31 DIAGNOSIS — Z01818 Encounter for other preprocedural examination: Secondary | ICD-10-CM

## 2019-01-31 MED ORDER — NORETHIN ACE-ETH ESTRAD-FE 1.5-30 MG-MCG PO TABS: 1.0000 | ORAL_TABLET | Freq: Every day | ORAL | 4 refills | Status: DC

## 2019-01-31 NOTE — Patient Instructions (Signed)
1. Encounter for routine gynecological examination with Papanicolaou smear of cervix Normal gynecologic exam.  Pap reflex done.  Breast exam normal.  Will schedule a screening mammogram at the breast center now.  Fasting health labs here today.  Body mass index 27.81.  Recommend a slightly lower calorie/carb diet such as [employer] and increased physical activities with aerobic activities 5 times a week and weightlifting every 2 days. - CBC - Comp Met (CMET) - TSH - Lipid panel - VITAMIN D 25 Hydroxy (Vit-D Deficiency, Fractures)  2. Screen for STD (sexually transmitted disease) - HIV antibody (with reflex)  3. Preop examination Planning bilateral breast augmentation in August 2020 in [place]. - Hemoglobin A1C - Protime-INR ( [facility] ONLY) - PTT - hCG, serum, qualitative  4. Encounter for surveillance of contraceptive pills Well on Microgestin FE 1/20.  Stopped smoking 2 months ago.  I was not aware that patient was a smoker, will have to stop birth control pills if resumes smoking.  Prescription sent to pharmacy.  Other orders - ALBUTEROL IN; Inhale into the lungs. - Multiple Vitamin (MULTIVITAMIN) tablet; Take 1 tablet by mouth daily. - COLLAGEN PO; Take by mouth. - vitamin C (ASCORBIC ACID) 250 MG tablet; Take 250 mg by mouth daily. - Ferrous Gluconate (IRON 27 PO); Take by mouth. - norethindrone-ethinyl estradiol-iron (LOESTRIN FE) 1.5-30 MG-MCG tablet; Take 1 tablet by mouth daily.  [place], fue un placer verle hoy!  voy a informarle de sus [employer].

## 2019-01-31 NOTE — Progress Notes (Signed)
Sherry Hunt August 02, 1976 [id number]   History:    43 y.o. G2P2L2 Married  RP:  Established patient presenting for annual gyn exam   HPI: Well on Microgestin Fe 1/20.  No breakthrough bleeding.  No pelvic pain.  No pain with intercourse.  Normal vaginal secretions.  Urine and bowel movements normal.  Body mass index 27.81.  Needs to increase physical activities. Breasts normal.  History of bilateral breast reduction, planning a Breast Augmentation surgery 03/2019 in [place].  Would like to do the preop labs here today.  Patient is fasting so we will also do that fasting health labs.    Past medical history,surgical history, family history and social history were all reviewed and documented in the EPIC chart.  Gynecologic History No LMP recorded. (Menstrual status: Oral contraceptives). Contraception: OCP (estrogen/progesterone) Last Pap: 05/2018. Results were: Negative Last mammogram: 12/2016. Results were: Negative Bone Density: Never Colonoscopy: Never  Obstetric History OB History  Gravida Para Term Preterm AB Living  '2 2       2  ' SAB TAB Ectopic Multiple Live Births               # Outcome Date GA Lbr Len/2nd Weight Sex Delivery Anes PTL Lv  2 Para           1 Para              ROS: A ROS was performed and pertinent positives and negatives are included in the history.  GENERAL: No fevers or chills. HEENT: No change in vision, no earache, sore throat or sinus congestion. NECK: No pain or stiffness. CARDIOVASCULAR: No chest pain or pressure. No palpitations. PULMONARY: No shortness of breath, cough or wheeze. GASTROINTESTINAL: No abdominal pain, nausea, vomiting or diarrhea, melena or bright red blood per rectum. GENITOURINARY: No urinary frequency, urgency, hesitancy or dysuria. MUSCULOSKELETAL: No joint or muscle pain, no back pain, no recent trauma. DERMATOLOGIC: No rash, no itching, no lesions. ENDOCRINE: No polyuria, polydipsia, no heat or cold intolerance. No recent  change in weight. HEMATOLOGICAL: No anemia or easy bruising or bleeding. NEUROLOGIC: No headache, seizures, numbness, tingling or weakness. PSYCHIATRIC: No depression, no loss of interest in normal activity or change in sleep pattern.     Exam:   BP 136/90   Ht '5\' 3"'  (1.6 m)   Wt 157 lb (71.2 kg)   BMI 27.81 kg/m   Body mass index is 27.81 kg/m.  General appearance : Well developed well nourished female. No acute distress HEENT: Eyes: no retinal hemorrhage or exudates,  Neck supple, trachea midline, no carotid bruits, no thyroidmegaly Lungs: Clear to auscultation, no rhonchi or wheezes, or rib retractions  Heart: Regular rate and rhythm, no murmurs or gallops Breast:Examined in sitting and supine position were symmetrical in appearance, no palpable masses or tenderness,  no skin retraction, no nipple inversion, no nipple discharge, no skin discoloration, no axillary or supraclavicular lymphadenopathy Abdomen: no palpable masses or tenderness, no rebound or guarding Extremities: no edema or skin discoloration or tenderness  Pelvic: Vulva: Normal             Vagina: No gross lesions or discharge  Cervix: No gross lesions or discharge.  Pap reflex done.  Uterus AV, normal size, shape and consistency, non-tender and mobile  Adnexa  Without masses or tenderness  Anus: Normal   Assessment/Plan:  43 y.o. female for annual exam   1. Encounter for routine gynecological examination with Papanicolaou smear of cervix Normal gynecologic exam.  Pap reflex done.  Breast exam normal.  Will schedule a screening mammogram at the breast center now.  Fasting health labs here today.  Body mass index 27.81.  Recommend a slightly lower calorie/carb diet such as [employer] and increased physical activities with aerobic activities 5 times a week and weightlifting every 2 days. - CBC - Comp Met (CMET) - TSH - Lipid panel - VITAMIN D 25 Hydroxy (Vit-D Deficiency, Fractures)  2. Screen for STD  (sexually transmitted disease) - HIV antibody (with reflex)  3. Preop examination Planning bilateral breast augmentation in August 2020 in [place]. - Hemoglobin A1C - Protime-INR ( [facility] ONLY) - PTT - hCG, serum, qualitative  4. Encounter for surveillance of contraceptive pills Well on Microgestin FE 1/20.  Stopped smoking 2 months ago.  I was not aware that patient was a smoker, will have to stop birth control pills if resumes smoking.  Prescription sent to pharmacy.  Other orders - ALBUTEROL IN; Inhale into the lungs. - Multiple Vitamin (MULTIVITAMIN) tablet; Take 1 tablet by mouth daily. - COLLAGEN PO; Take by mouth. - vitamin C (ASCORBIC ACID) 250 MG tablet; Take 250 mg by mouth daily. - Ferrous Gluconate (IRON 27 PO); Take by mouth. - norethindrone-ethinyl estradiol-iron (LOESTRIN FE) 1.5-30 MG-MCG tablet; Take 1 tablet by mouth daily.  Princess Bruins MD, 9:52 AM 01/31/2019

## 2019-01-31 NOTE — Addendum Note (Signed)
Addended by: Thurnell Garbe A on: 01/31/2019 11:18 AM   Modules accepted: Orders

## 2019-02-01 LAB — COMPREHENSIVE METABOLIC PANEL
AG Ratio: 1.4 (calc) (ref 1.0–2.5)
ALT: 19 U/L (ref 6–29)
AST: 20 U/L (ref 10–30)
Albumin: 4.2 g/dL (ref 3.6–5.1)
Alkaline phosphatase (APISO): 38 U/L (ref 31–125)
BUN: 9 mg/dL (ref 7–25)
CO2: 23 mmol/L (ref 20–32)
Calcium: 10.1 mg/dL (ref 8.6–10.2)
Chloride: 105 mmol/L (ref 98–110)
Creat: 0.9 mg/dL (ref 0.50–1.10)
Globulin: 2.9 g/dL (calc) (ref 1.9–3.7)
Glucose, Bld: 98 mg/dL (ref 65–99)
Potassium: 4.5 mmol/L (ref 3.5–5.3)
Sodium: 136 mmol/L (ref 135–146)
Total Bilirubin: 0.4 mg/dL (ref 0.2–1.2)
Total Protein: 7.1 g/dL (ref 6.1–8.1)

## 2019-02-01 LAB — PAP IG W/ RFLX HPV ASCU

## 2019-02-01 LAB — CBC
HCT: 42.7 % (ref 35.0–45.0)
Hemoglobin: 13.7 g/dL (ref 11.7–15.5)
MCH: 28.3 pg (ref 27.0–33.0)
MCHC: 32.1 g/dL (ref 32.0–36.0)
MCV: 88.2 fL (ref 80.0–100.0)
MPV: 10.8 fL (ref 7.5–12.5)
Platelets: 331 10*3/uL (ref 140–400)
RBC: 4.84 10*6/uL (ref 3.80–5.10)
RDW: 13.1 % (ref 11.0–15.0)
WBC: 6.4 10*3/uL (ref 3.8–10.8)

## 2019-02-01 LAB — LIPID PANEL
Cholesterol: 203 mg/dL — ABNORMAL HIGH (ref <200)
HDL: 50 mg/dL (ref >=50)
LDL Cholesterol (Calc): 123 mg/dL (calc) — ABNORMAL HIGH
Non-HDL Cholesterol (Calc): 153 mg/dL (calc) — ABNORMAL HIGH (ref <130)
Total CHOL/HDL Ratio: 4.1 (calc) (ref <5.0)
Triglycerides: 188 mg/dL — ABNORMAL HIGH (ref <150)

## 2019-02-01 LAB — TSH: TSH: 1.2 mIU/L

## 2019-02-01 LAB — HEMOGLOBIN A1C
Hgb A1c MFr Bld: 5.5 % of total Hgb (ref <5.7)
Mean Plasma Glucose: 111 (calc)
eAG (mmol/L): 6.2 (calc)

## 2019-02-01 LAB — APTT: aPTT: 29 s (ref 22–34)

## 2019-02-01 LAB — HIV ANTIBODY (ROUTINE TESTING W REFLEX): HIV 1&2 Ab, 4th Generation: NONREACTIVE

## 2019-02-01 LAB — HCG, SERUM, QUALITATIVE: Preg, Serum: NEGATIVE

## 2019-02-01 LAB — PROTIME-INR
INR: 1
Prothrombin Time: 10.3 s (ref 9.0–11.5)

## 2019-02-01 LAB — VITAMIN D 25 HYDROXY (VIT D DEFICIENCY, FRACTURES): Vit D, 25-Hydroxy: 27 ng/mL — ABNORMAL LOW (ref 30–100)

## 2019-02-06 ENCOUNTER — Other Ambulatory Visit: Payer: Self-pay

## 2019-02-06 ENCOUNTER — Encounter (HOSPITAL_COMMUNITY): Payer: Self-pay

## 2019-02-06 ENCOUNTER — Emergency Department (HOSPITAL_COMMUNITY): Payer: Self-pay

## 2019-02-06 ENCOUNTER — Emergency Department (HOSPITAL_COMMUNITY)
Admission: EM | Admit: 2019-02-06 | Discharge: 2019-02-06 | Disposition: A | Discharge status: Home / Self Care | Payer: Self-pay | Attending: Emergency Medicine | Admitting: Emergency Medicine

## 2019-02-06 DIAGNOSIS — J4541 Moderate persistent asthma with (acute) exacerbation: Secondary | ICD-10-CM | POA: Insufficient documentation

## 2019-02-06 DIAGNOSIS — Z79899 Other long term (current) drug therapy: Secondary | ICD-10-CM | POA: Insufficient documentation

## 2019-02-06 DIAGNOSIS — R0602 Shortness of breath: Secondary | ICD-10-CM

## 2019-02-06 DIAGNOSIS — Z87891 Personal history of nicotine dependence: Secondary | ICD-10-CM | POA: Insufficient documentation

## 2019-02-06 LAB — CBC WITH DIFFERENTIAL/PLATELET
Abs Immature Granulocytes: 0.04 10*3/uL (ref 0.00–0.07)
Basophils Absolute: 0 10*3/uL (ref 0.0–0.1)
Basophils Relative: 1 %
Eosinophils Absolute: 0.5 10*3/uL (ref 0.0–0.5)
Eosinophils Relative: 8 %
HCT: 38.6 % (ref 36.0–46.0)
Hemoglobin: 12.7 g/dL (ref 12.0–15.0)
Immature Granulocytes: 1 %
Lymphocytes Relative: 41 %
Lymphs Abs: 2.6 10*3/uL (ref 0.7–4.0)
MCH: 29.1 pg (ref 26.0–34.0)
MCHC: 32.9 g/dL (ref 30.0–36.0)
MCV: 88.5 fL (ref 80.0–100.0)
Monocytes Absolute: 0.5 10*3/uL (ref 0.1–1.0)
Monocytes Relative: 8 %
Neutro Abs: 2.5 10*3/uL (ref 1.7–7.7)
Neutrophils Relative %: 41 %
Platelets: 259 10*3/uL (ref 150–400)
RBC: 4.36 MIL/uL (ref 3.87–5.11)
RDW: 14.6 % (ref 11.5–15.5)
WBC: 6.2 10*3/uL (ref 4.0–10.5)
nRBC: 0 % (ref 0.0–0.2)

## 2019-02-06 LAB — BASIC METABOLIC PANEL
Anion gap: 8 (ref 5–15)
BUN: 12 mg/dL (ref 6–20)
CO2: 21 mmol/L — ABNORMAL LOW (ref 22–32)
Calcium: 9.3 mg/dL (ref 8.9–10.3)
Chloride: 110 mmol/L (ref 98–111)
Creatinine, Ser: 0.89 mg/dL (ref 0.44–1.00)
GFR calc Af Amer: >60 mL/min (ref >60)
GFR calc non Af Amer: >60 mL/min (ref >60)
Glucose, Bld: 93 mg/dL (ref 70–99)
Potassium: 3.9 mmol/L (ref 3.5–5.1)
Sodium: 139 mmol/L (ref 135–145)

## 2019-02-06 LAB — I-STAT BETA HCG BLOOD, ED (MC, WL, AP ONLY): I-stat hCG, quantitative: <5 m[IU]/mL (ref <5)

## 2019-02-06 LAB — D-DIMER, QUANTITATIVE: D-Dimer, Quant: 0.38 ug/mL-FEU (ref 0.00–0.50)

## 2019-02-06 MED ORDER — PREDNISONE 10 MG (21) PO TBPK: ORAL_TABLET | Freq: Every day | ORAL | 0 refills | Status: DC

## 2019-02-06 MED ORDER — ALBUTEROL SULFATE HFA 108 (90 BASE) MCG/ACT IN AERS: 1.0000 | INHALATION_SPRAY | Freq: Four times a day (QID) | RESPIRATORY_TRACT | 0 refills | Status: DC | PRN

## 2019-02-06 MED ORDER — ALBUTEROL SULFATE HFA 108 (90 BASE) MCG/ACT IN AERS
8.0000 | INHALATION_SPRAY | Freq: Once | RESPIRATORY_TRACT | Status: AC
  Administered 2019-02-06: 8 via RESPIRATORY_TRACT
  Filled 2019-02-06: qty 6.7

## 2019-02-06 MED ORDER — PREDNISONE 20 MG PO TABS
60.0000 mg | ORAL_TABLET | Freq: Once | ORAL | Status: AC
  Administered 2019-02-06: 60 mg via ORAL
  Filled 2019-02-06: qty 3

## 2019-02-06 NOTE — ED Notes (Signed)
PATIENT REMAINED AT 95-97% O2 STATS WHILE AMBULATING.

## 2019-02-06 NOTE — ED Notes (Signed)
Bed: WA09 Expected date:  Expected time:  Means of arrival:  Comments: 

## 2019-02-06 NOTE — Discharge Instructions (Signed)
Follow up with PCP for reevaluation. Take the medications as prescribed.  Return to the ED with any new or worsening symptoms.

## 2019-02-06 NOTE — ED Provider Notes (Signed)
[facility] DEPT Provider Note   CSN: [id number] Arrival date & time: 02/06/19  0709  History   Chief Complaint Asthma  HPI Vinie Charity is a 43 y.o. female with past medical history significant for asthma, GERD, endometrial polyps, tobacco abuse who presents for evaluation of SOB and asthma exacerbation.  Patient states she was recently diagnosed with asthma and given asthma inhaler by her sister who [place] out of the [place].  Patient states this happened 2 months ago. Patient states she is currently out of her albuterol inhaler. States she has had Architect workers at her house who have been standing her ceiling as well as painting over the last 2 days.  Patient states she woke up this morning with shortness of breath which has persisted over 2 hours. She does not have a rescue inhaler to use. Patient notes to nonproductive cough. Denies prior history of PE or DVT however admits to pressure to her legs. Denies hemoptysis, chest pain. Denies fever, chills, nausea, vomiting, abdominal pain, diarrhea, dysuria. She is on OCPs. Has not take anything at home for her symptoms.  Denies prior hx of hospitalizations, ICU stay or intubations for asthma.  History obtained from patient and past medical records. No interpretor was used.     HPI  Past Medical History:  Diagnosis Date  . Endometrial polyp    AND INTRAUTERINE LESIONS  . GERD (gastroesophageal reflux disease)   . Menorrhagia     There are no active problems to display for this patient.   Past Surgical History:  Procedure Laterality Date  . DILATATION & CURETTAGE/HYSTEROSCOPY WITH MYOSURE N/A 02/11/2017   Procedure: DILATATION & CURETTAGE/HYSTEROSCOPY WITH MYOSURE;  Surgeon: Princess Bruins, MD;  Location: [facility];  Service: Gynecology;  Laterality: N/A;  request 1:00pm OR time  request one hour  . REDUCTION MAMMAPLASTY Bilateral 2009   and Liposuction     OB History     Gravida  2   Para  2   Term      Preterm      AB      Living  2     SAB      TAB      Ectopic      Multiple      Live Births               Home Medications    Prior to Admission medications   Medication Sig Start Date End Date Taking? Authorizing Provider  albuterol (VENTOLIN HFA) 108 (90 Base) MCG/ACT inhaler Inhale 1-2 puffs into the lungs every 6 (six) hours as needed for wheezing or shortness of breath. 02/06/19   Jahanna Raether A, PA-C  ALBUTEROL IN Inhale into the lungs.    [provider]  COLLAGEN PO Take by mouth.    [provider]  Ferrous Gluconate (IRON 27 PO) Take by mouth.    [provider]  ibuprofen (ADVIL,MOTRIN) 200 MG tablet Take 200 mg by mouth every 6 (six) hours as needed.    [provider]  Magnesium 500 MG CAPS Take 1 capsule by mouth daily.    [provider]  Multiple Vitamin (MULTIVITAMIN) tablet Take 1 tablet by mouth daily.    [provider]  norethindrone-ethinyl estradiol-iron (LOESTRIN FE) 1.5-30 MG-MCG tablet Take 1 tablet by mouth daily. 01/31/19   Princess Bruins, MD  predniSONE (STERAPRED UNI-PAK 21 TAB) 10 MG (21) TBPK tablet Take by mouth daily. Take 6 tabs by mouth  daily  for 1 days, then 5 tabs for 1 days, then 4 tabs for 1 days, then 3 tabs for 1 days, 2 tabs for 1 days, then 1 tab by mouth daily for 1 days 02/06/19   Victorya Hillman A, PA-C  vitamin C (ASCORBIC ACID) 250 MG tablet Take 250 mg by mouth daily.    [provider]    Family History Family History  Problem Relation Age of Onset  . Diabetes Mother   . Hypertension Mother   . Cancer Maternal Aunt        COLON  . Breast cancer Maternal Aunt     Social History Social History   Tobacco Use  . Smoking status: Former Smoker    Packs/day: 0.50    Years: 10.00    Pack years: 5.00    Types: Cigarettes    Quit date: 01/01/2019    Years since quitting: 0.0  . Smokeless tobacco: Never Used  .  Tobacco comment: 10 CIG. PER DAY  Substance Use Topics  . Alcohol use: Yes    Comment: OCCASIONAL  . Drug use: No   Allergies   Iodine  Review of Systems Review of Systems  Constitutional: Negative.   HENT: Negative.   Respiratory: Positive for cough, shortness of breath and wheezing. Negative for apnea, choking, chest tightness and stridor.   Cardiovascular: Negative.   Gastrointestinal: Negative.   Genitourinary: Negative.   Musculoskeletal: Negative.   Skin: Negative.   Neurological: Negative.   All other systems reviewed and are negative.  Physical Exam Updated Vital Signs BP 137/86   Pulse 92   Temp 98 F (36.7 C) (Oral)   Resp 18   Ht 5\' 8"  (1.727 m)   Wt 71.2 kg   SpO2 100%   BMI 23.87 kg/m   Physical Exam Vitals signs and nursing note reviewed.  Constitutional:      General: She is not in acute distress.    Appearance: She is not ill-appearing, toxic-appearing or diaphoretic.  HENT:     Head: Normocephalic and atraumatic.     Jaw: There is normal jaw occlusion.     Nose:     Comments: Clear rhinorrhea and congestion to bilateral nares.  No sinus tenderness.    Mouth/Throat:     Mouth: Mucous membranes are moist.     Pharynx: Oropharynx is clear.     Comments: Posterior oropharynx clear.  Mucous membranes moist.  Tonsils without erythema or exudate.  Uvula midline without deviation.  No evidence of PTA or RPA.  No drooling, dysphasia or trismus.  Phonation normal. Neck:     Trachea: Trachea and phonation normal.     Meningeal: Brudzinski's sign and Kernig's sign absent.     Comments: No Neck stiffness or neck rigidity.  No meningismus.  No cervical lymphadenopathy. Cardiovascular:     Comments: No murmurs rubs or gallops. Pulmonary:     Comments: Moderate expiratory wheeze.  No accessory muscle usage.  Speaks in short sentences and pauses to take a breath. Abdominal:     Comments: Soft, nontender without rebound or guarding.  No CVA tenderness.   Musculoskeletal:     Comments: Moves all 4 extremities without difficulty.  Lower extremities without edema, erythema or warmth.  Skin:    Comments: Brisk capillary refill.  No rashes or lesions.  Neurological:     Mental Status: She is alert.     Comments: Ambulatory in department without difficulty.  Cranial nerves II through XII grossly  intact.  No facial droop.  No aphasia.    ED Treatments / Results  Labs (all labs ordered are listed, but only abnormal results are displayed) Labs Reviewed  BASIC METABOLIC PANEL - Abnormal; Notable for the following components:      Result Value   CO2 21 (*)    All other components within normal limits  CBC WITH DIFFERENTIAL/PLATELET  D-DIMER, QUANTITATIVE (NOT AT [facility])  I-STAT BETA HCG BLOOD, ED ([facility], [facility], [facility] ONLY)    EKG EKG Interpretation  Date/Time:  Tuesday February 06 2019 07:24:20 EDT Ventricular Rate:  111 PR Interval:    QRS Duration: 89 QT Interval:  322 QTC Calculation: 438 R Axis:   72 Text Interpretation:  Sinus tachycardia Right atrial enlargement Baseline wander Abnormal ECG Confirmed by Carmin Muskrat [phone]) on 02/06/2019 8:45:19 AM   Radiology Dg Chest Port 1 View  Result Date: 02/06/2019 CLINICAL DATA:  Shortness of breath EXAM: PORTABLE CHEST 1 VIEW COMPARISON:  None. FINDINGS: There is a small calcified granuloma in the left mid lung. Lungs elsewhere clear. Heart size and pulmonary vascularity are normal. No adenopathy. No bone lesions. IMPRESSION: No edema or consolidation.  Small granuloma left mid lung. Electronically Signed   By: Lowella Grip III M.D.   On: 02/06/2019 08:20    Procedures Procedures (including critical care time)  Medications Ordered in ED Medications  albuterol (VENTOLIN HFA) 108 (90 Base) MCG/ACT inhaler 8 puff (8 puffs Inhalation Given 02/06/19 0751)  predniSONE (DELTASONE) tablet 60 mg (60 mg Oral Given 02/06/19 0749)     Initial Impression / Assessment and Plan / ED Course  I have  reviewed the triage vital signs and the nursing notes.  Pertinent labs & imaging results that were available during my care of the patient were reviewed by me and considered in my medical decision making (see chart for details).  43 year old female appears otherwise well presents for evaluation of shortness of breath.  Afebrile, nonseptic, non-ill-appearing.  History of asthma and has been out of her home albuterol inhaler.  Has had construction workers at her house who have been standing for popcorn ceiling as well as painting.  Mild nonproductive cough.  No known COVID exposures.  On exam she has diffuse expiratory wheezing.  She is able to speak in short sentences however has to pause to take a breath.  No accessory muscle usage.  She is tachycardic and tachypneic however has no hypoxia.  She is currently on OCPs.  No unilateral leg swelling, redness or warmth.  No hemoptysis, recent surgery or recent mobilization.  No history of PE or DVTs.  Given tachycardia, tachypnea and on OCPs will obtain d-dimer to rule out PE however I have high suspicion for asthma exacerbation.  Labs and Imaging personally reviewed EKG with sinus tachycardia, no ST/T changes.  No STEMI. Plain film chest without evidence of infiltrates, cardiomegaly, pneumothorax, pulmonary edema.  She does have a small granuloma to left lung. CBC without leukocytosis, hemoglobin 66.0 Metabolic panel CO2 at 21, no additional electrolyte, renal or liver abnormality Pregnancy negative D-dimer negative  On reevaluation vital signs with improvement with albuterol and steroids.  Patient is no longer tachycardic or tachypneic.  On reevaluation her lungs with significant improvement with no wheeze.  She is able speak in full sentences without difficulty.  No accessory muscle usage.  Low suspicion for ACS, PE, dissection, pericarditis, myocarditis, infectious process, pneumothorax, COVID.  Patient able to ambulate in ED with oxygen saturation  >95% on RA,  no current signs of respiratory distress. Prednisone given in the ED and pt will be discharged with 5 day burst. Pt states they are breathing at baseline. Pt has been instructed to continue using prescribed medications and to speak with PCP about today's exacerbation.   The patient has been appropriately medically screened and/or stabilized in the ED. I have low suspicion for any other emergent medical condition which would require further screening, evaluation or treatment in the ED or require inpatient management.  Patient is hemodynamically stable and in no acute distress.  Patient able to ambulate in department prior to ED.  Evaluation does not show acute pathology that would require ongoing or additional emergent interventions while in the emergency department or further inpatient treatment.  I have discussed the diagnosis with the patient and answered all questions.  Patient has no further complaints prior to discharge.  Patient is comfortable with plan discussed in room and is stable for discharge at this time.  I have discussed strict return precautions for returning to the emergency department.  Patient was encouraged to follow-up with PCP/specialist refer to at discharge.      *Mikell Norma Fredrickson was evaluated in Emergency Department on 02/06/2019 for the symptoms described in the history of present illness. She was evaluated in the context of the global COVID-19 pandemic, which necessitated consideration that the patient might be at risk for infection with the SARS-CoV-2 virus that causes COVID-19. Institutional protocols and algorithms that pertain to the evaluation of patients at risk for COVID-19 are in a state of rapid change based on information released by regulatory bodies including the CDC and federal and state organizations. These policies and algorithms were followed during the patient's care in the ED. Final Clinical Impressions(s) / ED Diagnoses   Final diagnoses:   Moderate persistent asthma with exacerbation    ED Discharge Orders         Ordered    predniSONE (STERAPRED UNI-PAK 21 TAB) 10 MG (21) TBPK tablet  Daily     02/06/19 0905    albuterol (VENTOLIN HFA) 108 (90 Base) MCG/ACT inhaler  Every 6 hours PRN     02/06/19 0905           Christiana Gurevich A, PA-C 02/06/19 0934    Carmin Muskrat, MD 02/06/19 1017

## 2019-02-06 NOTE — ED Triage Notes (Signed)
Patient reports that she has not been feeling well x 2 days and became SOB today. Patient reports that she has had construction workers in her home. patient denies any fever or body aches.

## 2019-05-15 ENCOUNTER — Encounter: Payer: Self-pay | Admitting: Obstetrics & Gynecology

## 2019-12-04 ENCOUNTER — Encounter: Payer: Self-pay | Admitting: Nurse Practitioner

## 2019-12-04 ENCOUNTER — Ambulatory Visit (INDEPENDENT_AMBULATORY_CARE_PROVIDER_SITE_OTHER): Payer: Self-pay | Admitting: Nurse Practitioner

## 2019-12-04 ENCOUNTER — Other Ambulatory Visit: Payer: Self-pay

## 2019-12-04 VITALS — BP 126/84

## 2019-12-04 DIAGNOSIS — Z9889 Other specified postprocedural states: Secondary | ICD-10-CM

## 2019-12-04 DIAGNOSIS — N644 Mastodynia: Secondary | ICD-10-CM

## 2019-12-04 NOTE — Progress Notes (Signed)
   Acute Office Visit  Subjective:    Patient ID: Sherry Hunt, female    DOB: 10/21/1975, 44 y.o.   MRN: [id number]  Chief Complaint  Patient presents with  . Breast Problem    ML: C/O:  bilateral breast tenderness/burning x 2 months also low back pain on and off -   . mammogram    2018  . interpreter    sonia freeman    HPI 44 year old MHF G2 P2 resents today for right breast pain that began 2 months ago.  Describes the pain as burning, radiates to back right shoulder blade, and is painful to lay on.  Her job requires lots of arm work and bothers her by the end of the shift.  Takes ibuprofen as needed for pain with some relief.  History of bilateral breast reduction 12 years ago. Last mammogram 01/17/2017-negative for malignancy, right breast asymmetry due to fibroglandular tissue. Maternal Aunt had breast cancer. Doing well on Loestin Fe-having regular cycles.   Review of Systems  Constitutional: Negative.   Respiratory: Negative.   Cardiovascular: Negative.   Genitourinary: Negative.   Right breast: pain in upper margin, no nipple discharge     Objective:    Physical Exam Constitutional:      Appearance: Normal appearance.  Cardiovascular:     Rate and Rhythm: Normal rate and regular rhythm.  Pulmonary:     Effort: Pulmonary effort is normal.     Breath sounds: Normal breath sounds.  Chest:     Breasts:        Right: Mass and tenderness present. No swelling, inverted nipple, nipple discharge or skin change.        Left: Normal. No swelling, inverted nipple, mass, nipple discharge, skin change or tenderness.    Lymphadenopathy:     Upper Body:     Right upper body: No axillary adenopathy.     Left upper body: No axillary adenopathy.     BP 126/84   LMP 11/24/2019 Comment: pill Wt Readings from Last 3 Encounters:  02/06/19 157 lb (71.2 kg)  01/31/19 157 lb (71.2 kg)  05/09/18 153 lb (69.4 kg)        Assessment & Plan:   Problem List Items Addressed  This Visit    None    Visit Diagnoses    Breast pain, right    -  Primary   H/O bilateral breast reduction surgery         Plan: We will schedule mammogram.  Reassurance provided that majority of breast cancers do not present with pain and appears to be in the same area where the density was found on her mammogram in 2018. Continue ibuprofen and Tylenol for pain as needed.     Tamela Gammon [facility], 2:30 PM 12/04/2019

## 2019-12-05 ENCOUNTER — Telehealth: Payer: Self-pay | Admitting: *Deleted

## 2019-12-05 DIAGNOSIS — N644 Mastodynia: Secondary | ICD-10-CM

## 2019-12-05 NOTE — Telephone Encounter (Signed)
Order placed at the breast center for bilateral diagnostic and right breast ultrasound. They will call to schedule.

## 2019-12-05 NOTE — Telephone Encounter (Signed)
-----   Message from Tamela Gammon, NP sent at 12/04/2019  2:19 PM EDT ----- Please schedule mammogram for non-cyclic right breast pain, tender fibrous area at 11 o'clock. Last mammogram was in 2018. Thank you

## 2019-12-06 NOTE — Telephone Encounter (Signed)
Patient is self pay , [facility] will call to schedule.

## 2019-12-07 ENCOUNTER — Other Ambulatory Visit: Payer: Self-pay

## 2019-12-07 DIAGNOSIS — N644 Mastodynia: Secondary | ICD-10-CM

## 2019-12-10 NOTE — Telephone Encounter (Signed)
Patient scheduled on 12/20/19 at [facility]

## 2019-12-20 ENCOUNTER — Ambulatory Visit: Payer: Self-pay | Admitting: *Deleted

## 2019-12-20 ENCOUNTER — Ambulatory Visit
Admission: RE | Admit: 2019-12-20 | Discharge: 2019-12-20 | Disposition: A | Discharge status: Home / Self Care | Payer: Self-pay | Source: Ambulatory Visit | Attending: Obstetrics and Gynecology | Admitting: Obstetrics and Gynecology

## 2019-12-20 ENCOUNTER — Other Ambulatory Visit: Payer: Self-pay

## 2019-12-20 VITALS — BP 138/84 | Temp 97.3°F | Wt 154.6 lb

## 2019-12-20 DIAGNOSIS — N644 Mastodynia: Secondary | ICD-10-CM

## 2019-12-20 DIAGNOSIS — Z1239 Encounter for other screening for malignant neoplasm of breast: Secondary | ICD-10-CM

## 2019-12-20 NOTE — Patient Instructions (Addendum)
Explained breast self awareness with Sherry Hunt. Patient did not need a Pap smear today due to last Pap smear was 01/31/2019. Let her know [facility] will cover Pap smears every 3 years unless has a history of abnormal Pap smears. Referred patient to the [facility] for a diagnostic mammogram. Appointment scheduled for Thursday, Dec 20, 2019 at 1410. Patient aware of appointment and will be there. Discussed smoking cessation with patient. Referred patient to the [place] quit line and gave information about the free smoking cessation classes at [facility]. Sherry Hunt verbalized understanding.  Sherry Hunt, Arvil Chaco, RN 1:35 PM

## 2019-12-20 NOTE — Progress Notes (Signed)
[name] is a 44 y.o. female who presents to [facility] clinic today with complaints of a right breast lump, and pain x 3 months. Patient stated the pain comes and goes. Patient rates the pain at a 5 out of 10.   Pap Smear: Pap not smear completed today. Last Pap smear was 01/31/2019 at [facility] clinic and was normal. Per patient has no history of an abnormal Pap smear. Last Pap smear result is in Epic.   Physical exam: Breasts Breasts symmetrical. No skin abnormalities bilateral breasts. No nipple retraction bilateral breasts. No nipple discharge bilateral breasts. No lymphadenopathy. No lumps palpated bilateral breasts. Unable to palpate a breast lump in patients area of concern. Patient complained right outer breast pain on exam.      Pelvic/Bimanual Pap is not indicated today per [facility] guidelines.   Smoking History: Patient is a current smoker. Discussed smoking cessation with patient. Referred patient to the [place] quit line and gave information about the free smoking cessation classes at [facility].    Patient Navigation: Patient education provided. Access to services provided for patient through [place] program. Spanish interpreter Sherry Hunt from [facility] provided.    Breast and Cervical Cancer Risk Assessment: Patient has no family history of breast cancer, known genetic mutations, or radiation treatment to the chest before age 38. Patient has no history of cervical dysplasia, immunocompromised, or DES exposure in-utero.  Risk Assessment    Risk Scores      12/20/2019   Last edited by: Sherry Revel, LPN   5-year risk: 0.4 %   Lifetime risk: 4.9 %          A: [facility] exam without pap smear  P: Referred patient to the [facility] for a diagnostic mammogram. Appointment scheduled for Thursday, Dec 20, 2019 at 1410.  Sherry Parish, RN 12/20/2019 1:35 PM

## 2020-02-01 ENCOUNTER — Encounter: Payer: Self-pay | Admitting: Obstetrics & Gynecology

## 2020-03-05 ENCOUNTER — Other Ambulatory Visit: Payer: Self-pay | Admitting: Obstetrics & Gynecology

## 2020-03-05 NOTE — Telephone Encounter (Signed)
Annual exam scheduled on 04/11/20

## 2020-04-11 ENCOUNTER — Encounter: Payer: Self-pay | Admitting: Obstetrics & Gynecology

## 2020-05-15 ENCOUNTER — Ambulatory Visit (INDEPENDENT_AMBULATORY_CARE_PROVIDER_SITE_OTHER): Payer: Self-pay | Admitting: Obstetrics & Gynecology

## 2020-05-15 ENCOUNTER — Other Ambulatory Visit: Payer: Self-pay

## 2020-05-15 ENCOUNTER — Encounter: Payer: Self-pay | Admitting: Obstetrics & Gynecology

## 2020-05-15 VITALS — BP 152/90 | Ht 63.0 in | Wt 158.2 lb

## 2020-05-15 DIAGNOSIS — Z3041 Encounter for surveillance of contraceptive pills: Secondary | ICD-10-CM

## 2020-05-15 DIAGNOSIS — N644 Mastodynia: Secondary | ICD-10-CM

## 2020-05-15 DIAGNOSIS — Z01419 Encounter for gynecological examination (general) (routine) without abnormal findings: Secondary | ICD-10-CM

## 2020-05-15 NOTE — Progress Notes (Signed)
Sherry Hunt 1976-02-25 [id number]   History:    44 y.o. G2P2L2 Married  RP:  Established patient presenting for annual gyn exam   HPI: On Junel Fe 1.5/30 with severe breast tenderness while on the active pills.  No breast lump felt.  H/O bilateral breast reduction.  No breakthrough bleeding.  No pelvic pain.  No pain with intercourse.  Normal vaginal secretions.  Urine and bowel movements normal.  Body mass index 28.02.  Needs to increase physical activities. Good nutrition.    Past medical history,surgical history, family history and social history were all reviewed and documented in the EPIC chart.  Gynecologic History Patient's last menstrual period was 04/15/2020.  Obstetric History OB History  Gravida Para Term Preterm AB Living  2 2       2   SAB TAB Ectopic Multiple Live Births               # Outcome Date GA Lbr Len/2nd Weight Sex Delivery Anes PTL Lv  2 Para           1 Para              ROS: A ROS was performed and pertinent positives and negatives are included in the history.  GENERAL: No fevers or chills. HEENT: No change in vision, no earache, sore throat or sinus congestion. NECK: No pain or stiffness. CARDIOVASCULAR: No chest pain or pressure. No palpitations. PULMONARY: No shortness of breath, cough or wheeze. GASTROINTESTINAL: No abdominal pain, nausea, vomiting or diarrhea, melena or bright red blood per rectum. GENITOURINARY: No urinary frequency, urgency, hesitancy or dysuria. MUSCULOSKELETAL: No joint or muscle pain, no back pain, no recent trauma. DERMATOLOGIC: No rash, no itching, no lesions. ENDOCRINE: No polyuria, polydipsia, no heat or cold intolerance. No recent change in weight. HEMATOLOGICAL: No anemia or easy bruising or bleeding. NEUROLOGIC: No headache, seizures, numbness, tingling or weakness. PSYCHIATRIC: No depression, no loss of interest in normal activity or change in sleep pattern.     Exam:   BP (!) 152/90   Ht 5\' 3"  (1.6 m)   Wt  158 lb 3.2 oz (71.8 kg)   LMP 04/15/2020 Comment: BIRTH CONTROL   BMI 28.02 kg/m   Body mass index is 28.02 kg/m.  General appearance : Well developed well nourished female. No acute distress HEENT: Eyes: no retinal hemorrhage or exudates,  Neck supple, trachea midline, no carotid bruits, no thyroidmegaly Lungs: Clear to auscultation, no rhonchi or wheezes, or rib retractions  Heart: Regular rate and rhythm, no murmurs or gallops Breast:Examined in sitting and supine position were symmetrical in appearance, no palpable masses or tenderness,  no skin retraction, no nipple inversion, no nipple discharge, no skin discoloration, no axillary or supraclavicular lymphadenopathy Abdomen: no palpable masses or tenderness, no rebound or guarding Extremities: no edema or skin discoloration or tenderness  Pelvic: Vulva: Normal             Vagina: No gross lesions or discharge  Cervix: No gross lesions or discharge.  Pap reflex done.  Uterus  AV, normal size, shape and consistency, non-tender and mobile  Adnexa  Without masses or tenderness  Anus: Normal   Assessment/Plan:  44 y.o. female for annual exam   1. Encounter for routine gynecological examination with Papanicolaou smear of cervix Normal gynecologic exam.  Pap reflex done.  Breast exam normal.  Screening mammogram May 2021 Negative.  Body mass index 28.02.  Increase fitness and continue with healthy nutrition.  Will  do fasting health labs at follow-up for Nexplanon insertion.  2. Encounter for surveillance of contraceptive pills Bilateral breast tenderness on birth control pills.  Normal breast exam.  Screening mammogram May 2021 was negative.  Will continue on birth control pills until follow-up for Nexplanon insertion.  Nexplanon benefits versus risks reviewed.  No contraindication.  3. Breast tenderness in female Breast tenderness bilaterally associated with birth control pills.  Decision to stop and change to Nexplanon for  contraception.  Follow-up Nexplanon insertion.  Princess Bruins MD, 3:00 PM 05/15/2020

## 2020-05-15 NOTE — Addendum Note (Signed)
Addended by: Thurnell Garbe A on: 05/15/2020 04:01 PM   Modules accepted: Orders

## 2020-05-19 LAB — PAP IG W/ RFLX HPV ASCU

## 2020-05-27 ENCOUNTER — Other Ambulatory Visit: Payer: Self-pay | Admitting: Obstetrics & Gynecology

## 2020-05-29 ENCOUNTER — Ambulatory Visit: Payer: Self-pay | Admitting: Obstetrics & Gynecology

## 2020-05-29 ENCOUNTER — Telehealth: Payer: Self-pay | Admitting: *Deleted

## 2020-05-29 NOTE — Telephone Encounter (Signed)
Patient called had to cancel her Nexplanon insertion appointment today due to having Covid, patient will contiune Junel FE 1.5-30 mcg tablet until she can reschedule Nexplanon insertion.

## 2020-07-08 ENCOUNTER — Other Ambulatory Visit: Payer: Self-pay

## 2020-07-08 ENCOUNTER — Encounter (HOSPITAL_COMMUNITY): Payer: Self-pay

## 2020-07-08 ENCOUNTER — Emergency Department (HOSPITAL_COMMUNITY)
Admission: EM | Admit: 2020-07-08 | Discharge: 2020-07-08 | Disposition: A | Discharge status: Home / Self Care | Payer: Self-pay | Attending: Emergency Medicine | Admitting: Emergency Medicine

## 2020-07-08 DIAGNOSIS — R1013 Epigastric pain: Secondary | ICD-10-CM

## 2020-07-08 DIAGNOSIS — K219 Gastro-esophageal reflux disease without esophagitis: Secondary | ICD-10-CM | POA: Insufficient documentation

## 2020-07-08 DIAGNOSIS — Z87891 Personal history of nicotine dependence: Secondary | ICD-10-CM | POA: Insufficient documentation

## 2020-07-08 LAB — COMPREHENSIVE METABOLIC PANEL
ALT: 17 U/L (ref 0–44)
AST: 18 U/L (ref 15–41)
Albumin: 3.8 g/dL (ref 3.5–5.0)
Alkaline Phosphatase: 36 U/L — ABNORMAL LOW (ref 38–126)
Anion gap: 9 (ref 5–15)
BUN: 10 mg/dL (ref 6–20)
CO2: 19 mmol/L — ABNORMAL LOW (ref 22–32)
Calcium: 9.3 mg/dL (ref 8.9–10.3)
Chloride: 107 mmol/L (ref 98–111)
Creatinine, Ser: 0.72 mg/dL (ref 0.44–1.00)
GFR, Estimated: >60 mL/min (ref >60)
Glucose, Bld: 123 mg/dL — ABNORMAL HIGH (ref 70–99)
Potassium: 3.8 mmol/L (ref 3.5–5.1)
Sodium: 135 mmol/L (ref 135–145)
Total Bilirubin: 0.1 mg/dL — ABNORMAL LOW (ref 0.3–1.2)
Total Protein: 7 g/dL (ref 6.5–8.1)

## 2020-07-08 LAB — CBC
HCT: 37.4 % (ref 36.0–46.0)
Hemoglobin: 12.5 g/dL (ref 12.0–15.0)
MCH: 29.1 pg (ref 26.0–34.0)
MCHC: 33.4 g/dL (ref 30.0–36.0)
MCV: 87 fL (ref 80.0–100.0)
Platelets: 236 10*3/uL (ref 150–400)
RBC: 4.3 MIL/uL (ref 3.87–5.11)
RDW: 13.9 % (ref 11.5–15.5)
WBC: 7.1 10*3/uL (ref 4.0–10.5)
nRBC: 0 % (ref 0.0–0.2)

## 2020-07-08 LAB — URINALYSIS, ROUTINE W REFLEX MICROSCOPIC
Bilirubin Urine: NEGATIVE
Glucose, UA: NEGATIVE mg/dL
Ketones, ur: NEGATIVE mg/dL
Leukocytes,Ua: NEGATIVE
Nitrite: NEGATIVE
Protein, ur: NEGATIVE mg/dL
Specific Gravity, Urine: 1.015 (ref 1.005–1.030)
pH: 5.5 (ref 5.0–8.0)

## 2020-07-08 LAB — URINALYSIS, MICROSCOPIC (REFLEX)

## 2020-07-08 LAB — PREGNANCY, URINE: Preg Test, Ur: NEGATIVE

## 2020-07-08 LAB — LIPASE, BLOOD: Lipase: 29 U/L (ref 11–51)

## 2020-07-08 MED ORDER — PANTOPRAZOLE SODIUM 20 MG PO TBEC: 20.0000 mg | DELAYED_RELEASE_TABLET | Freq: Every day | ORAL | 0 refills | Status: DC

## 2020-07-08 MED ORDER — ALUM & MAG HYDROXIDE-SIMETH 200-200-20 MG/5ML PO SUSP
30.0000 mL | Freq: Once | ORAL | Status: AC
  Administered 2020-07-08: 30 mL via ORAL
  Filled 2020-07-08: qty 30

## 2020-07-08 MED ORDER — SUCRALFATE 1 G PO TABS: 1.0000 g | ORAL_TABLET | Freq: Three times a day (TID) | ORAL | 0 refills | Status: DC

## 2020-07-08 MED ORDER — LIDOCAINE VISCOUS HCL 2 % MT SOLN
15.0000 mL | Freq: Once | OROMUCOSAL | Status: AC
  Administered 2020-07-08: 15 mL via ORAL
  Filled 2020-07-08: qty 15

## 2020-07-08 MED ORDER — SUCRALFATE 1 G PO TABS
1.0000 g | ORAL_TABLET | Freq: Once | ORAL | Status: AC
  Administered 2020-07-08: 1 g via ORAL
  Filled 2020-07-08: qty 1

## 2020-07-08 NOTE — ED Provider Notes (Signed)
[facility] DEPT Provider Note   CSN: [id number] Arrival date & time: 07/08/20  0354     History Chief Complaint  Patient presents with  . Abdominal Pain    Sherry Hunt is a 44 y.o. female.  The history is provided by the patient and medical records.  Abdominal Pain Associated symptoms: nausea    44 year old female with history of acid reflux, presenting to the ED with epigastric and left upper abdominal pain.  States this began today.  It is a burning sensation and feels like her prior episodes of acid reflux.  She takes omeprazole but has not had any relief.  Has had some nausea without vomiting.  She denies any intake of spicy or acidic foods.  No heavy NSAID use.  No history of stomach ulcers.  Past Medical History:  Diagnosis Date  . Endometrial polyp    AND INTRAUTERINE LESIONS  . GERD (gastroesophageal reflux disease)   . Menorrhagia     There are no problems to display for this patient.   Past Surgical History:  Procedure Laterality Date  . DILATATION & CURETTAGE/HYSTEROSCOPY WITH MYOSURE N/A 02/11/2017   Procedure: DILATATION & CURETTAGE/HYSTEROSCOPY WITH MYOSURE;  Surgeon: Princess Bruins, MD;  Location: [facility];  Service: Gynecology;  Laterality: N/A;  request 1:00pm OR time  request one hour  . Liposunction    . REDUCTION MAMMAPLASTY Bilateral 2009   and Liposuction     OB History    Gravida  2   Para  2   Term      Preterm      AB      Living  2     SAB      TAB      Ectopic      Multiple      Live Births              Family History  Problem Relation Age of Onset  . Diabetes Mother   . Hypertension Mother   . Cancer Mother        endometrial  . Cancer Maternal Aunt        COLON  . Stroke Father   . Hypertension Father   . Hyperthyroidism Sister   . Asthma Sister     Social History   Tobacco Use  . Smoking status: Former Smoker    Packs/day: 0.50    Years:  10.00    Pack years: 5.00    Types: Cigarettes    Quit date: 01/01/2019    Years since quitting: 1.5  . Smokeless tobacco: Never Used  . Tobacco comment: 10 CIG. PER DAY. Restarted smoking 06/2019  Vaping Use  . Vaping Use: Never used  Substance Use Topics  . Alcohol use: Yes    Comment: OCCASIONAL  . Drug use: No    Home Medications Prior to Admission medications   Medication Sig Start Date End Date Taking? Authorizing Provider  JUNEL FE 1.5/30 1.5-30 MG-MCG tablet TAKE 1 TABLET BY MOUTH EVERY DAY 05/27/20   Princess Bruins, MD    Allergies    Iodine  Review of Systems   Review of Systems  Gastrointestinal: Positive for abdominal pain and nausea.  All other systems reviewed and are negative.   Physical Exam Updated Vital Signs BP (!) 162/103 (BP Location: Right Arm)   Pulse 90   Temp 97.8 F (36.6 C) (Oral)   Resp 16   SpO2 98%   Physical Exam Vitals and  nursing note reviewed.  Constitutional:      Appearance: She is well-developed.  HENT:     Head: Normocephalic and atraumatic.  Eyes:     Conjunctiva/sclera: Conjunctivae normal.     Pupils: Pupils are equal, round, and reactive to light.  Cardiovascular:     Rate and Rhythm: Normal rate and regular rhythm.     Heart sounds: Normal heart sounds.  Pulmonary:     Effort: Pulmonary effort is normal.     Breath sounds: Normal breath sounds.  Abdominal:     General: Bowel sounds are normal.     Palpations: Abdomen is soft.     Tenderness: There is no abdominal tenderness. There is no rebound.     Comments: Endorses LUQ > epigastric pain, no focal tenderness, normal bowel sounds  Musculoskeletal:        General: Normal range of motion.     Cervical back: Normal range of motion.  Skin:    General: Skin is warm and dry.  Neurological:     Mental Status: She is alert and oriented to person, place, and time.     ED Results / Procedures / Treatments   Labs (all labs ordered are listed, but only abnormal  results are displayed) Labs Reviewed  COMPREHENSIVE METABOLIC PANEL - Abnormal; Notable for the following components:      Result Value   CO2 19 (*)    Glucose, Bld 123 (*)    Alkaline Phosphatase 36 (*)    Total Bilirubin 0.1 (*)    All other components within normal limits  URINALYSIS, ROUTINE W REFLEX MICROSCOPIC - Abnormal; Notable for the following components:   Hgb urine dipstick MODERATE (*)    All other components within normal limits  URINALYSIS, MICROSCOPIC (REFLEX) - Abnormal; Notable for the following components:   Bacteria, UA FEW (*)    All other components within normal limits  LIPASE, BLOOD  CBC  PREGNANCY, URINE    EKG None  Radiology No results found.  Procedures Procedures (including critical care time)  Medications Ordered in ED Medications  sucralfate (CARAFATE) tablet 1 g (1 g Oral Given 07/08/20 0600)  alum & mag hydroxide-simeth (MAALOX/MYLANTA) 200-200-20 MG/5ML suspension 30 mL (30 mLs Oral Given 07/08/20 0600)    And  lidocaine (XYLOCAINE) 2 % viscous mouth solution 15 mL (15 mLs Oral Given 07/08/20 0600)    ED Course  I have reviewed the triage vital signs and the nursing notes.  Pertinent labs & imaging results that were available during my care of the patient were reviewed by me and considered in my medical decision making (see chart for details).    MDM Rules/Calculators/A&P  44 year old female presenting to the ED with left upper and epigastric abdominal pain.  Has history of acid reflux and states this feels similar.  Reports a burning sensation.  Was recently taking Prilosec but is not had any relief.  She is afebrile nontoxic here. Abdomen soft/non-tender.  No recent EtOH, heavy NSAID use, spicy/acidic food intake. Her labs are reassuring.  Given dose of Carafate and GI cocktail.  Will reassess.  6:33 AM Patient feeling better after medications here.  Able to tolerate fluids without vomiting.  Feel she is stable for discharge.  States  she took omeprazole for 14 days but was scared to take longer due to package warnings.  Will start protonix.  Encouraged to limit spicy/acidic foods, watch NSAID use.  Close PCP follow-up.  Return here for any new/acute changes.  Final  Clinical Impression(s) / ED Diagnoses Final diagnoses:  Epigastric pain  Gastroesophageal reflux disease without esophagitis    Rx / DC Orders ED Discharge Orders         Ordered    pantoprazole (PROTONIX) 20 MG tablet  Daily        07/08/20 0634    sucralfate (CARAFATE) 1 g tablet  3 times daily with meals & bedtime        07/08/20 0634           Larene Pickett, PA-C 07/08/20 1548    Merryl Hacker, MD 07/08/20 [phone]

## 2020-07-08 NOTE — Discharge Instructions (Signed)
Take the prescribed medication as directed-- sent to pharmacy for you.   Try to limit spicy/acidic foods as this can worsen. Follow-up with your primary care doctor. Return to the ED for new or worsening symptoms.

## 2020-07-08 NOTE — ED Triage Notes (Signed)
Patient arrives with complaints of upper mid abdominal pain that started roughly 4 hours ago and NV.

## 2021-01-07 ENCOUNTER — Emergency Department (HOSPITAL_BASED_OUTPATIENT_CLINIC_OR_DEPARTMENT_OTHER)
Admission: EM | Admit: 2021-01-07 | Discharge: 2021-01-07 | Disposition: A | Discharge status: Home / Self Care | Payer: 59 | Attending: Emergency Medicine | Admitting: Emergency Medicine

## 2021-01-07 ENCOUNTER — Other Ambulatory Visit: Payer: Self-pay

## 2021-01-07 ENCOUNTER — Encounter (HOSPITAL_BASED_OUTPATIENT_CLINIC_OR_DEPARTMENT_OTHER): Payer: Self-pay

## 2021-01-07 DIAGNOSIS — R1013 Epigastric pain: Secondary | ICD-10-CM | POA: Diagnosis present

## 2021-01-07 DIAGNOSIS — K2900 Acute gastritis without bleeding: Secondary | ICD-10-CM | POA: Diagnosis not present

## 2021-01-07 DIAGNOSIS — Z87891 Personal history of nicotine dependence: Secondary | ICD-10-CM | POA: Insufficient documentation

## 2021-01-07 LAB — COMPREHENSIVE METABOLIC PANEL
ALT: 17 U/L (ref 0–44)
AST: 23 U/L (ref 15–41)
Albumin: 3.9 g/dL (ref 3.5–5.0)
Alkaline Phosphatase: 34 U/L — ABNORMAL LOW (ref 38–126)
Anion gap: 8 (ref 5–15)
BUN: 13 mg/dL (ref 6–20)
CO2: 24 mmol/L (ref 22–32)
Calcium: 10 mg/dL (ref 8.9–10.3)
Chloride: 103 mmol/L (ref 98–111)
Creatinine, Ser: 0.88 mg/dL (ref 0.44–1.00)
GFR, Estimated: >60 mL/min (ref >60)
Glucose, Bld: 108 mg/dL — ABNORMAL HIGH (ref 70–99)
Potassium: 5.4 mmol/L — ABNORMAL HIGH (ref 3.5–5.1)
Sodium: 135 mmol/L (ref 135–145)
Total Bilirubin: 0.3 mg/dL (ref 0.3–1.2)
Total Protein: 7.1 g/dL (ref 6.5–8.1)

## 2021-01-07 LAB — URINALYSIS, ROUTINE W REFLEX MICROSCOPIC
Bilirubin Urine: NEGATIVE
Glucose, UA: NEGATIVE mg/dL
Ketones, ur: NEGATIVE mg/dL
Leukocytes,Ua: NEGATIVE
Nitrite: NEGATIVE
Protein, ur: NEGATIVE mg/dL
Specific Gravity, Urine: 1.024 (ref 1.005–1.030)
pH: 7.5 (ref 5.0–8.0)

## 2021-01-07 LAB — CBC WITH DIFFERENTIAL/PLATELET
Abs Immature Granulocytes: 0.05 10*3/uL (ref 0.00–0.07)
Basophils Absolute: 0.1 10*3/uL (ref 0.0–0.1)
Basophils Relative: 1 %
Eosinophils Absolute: 0.3 10*3/uL (ref 0.0–0.5)
Eosinophils Relative: 3 %
HCT: 35.4 % — ABNORMAL LOW (ref 36.0–46.0)
Hemoglobin: 12.1 g/dL (ref 12.0–15.0)
Immature Granulocytes: 1 %
Lymphocytes Relative: 37 %
Lymphs Abs: 3.7 10*3/uL (ref 0.7–4.0)
MCH: 29.4 pg (ref 26.0–34.0)
MCHC: 34.2 g/dL (ref 30.0–36.0)
MCV: 85.9 fL (ref 80.0–100.0)
Monocytes Absolute: 1 10*3/uL (ref 0.1–1.0)
Monocytes Relative: 10 %
Neutro Abs: 5.1 10*3/uL (ref 1.7–7.7)
Neutrophils Relative %: 48 %
Platelets: 322 10*3/uL (ref 150–400)
RBC: 4.12 MIL/uL (ref 3.87–5.11)
RDW: 13.6 % (ref 11.5–15.5)
WBC: 10.1 10*3/uL (ref 4.0–10.5)
nRBC: 0 % (ref 0.0–0.2)

## 2021-01-07 LAB — URINALYSIS, MICROSCOPIC (REFLEX)
Bacteria, UA: NONE SEEN
WBC, UA: NONE SEEN WBC/hpf (ref 0–5)

## 2021-01-07 LAB — LIPASE, BLOOD: Lipase: 26 U/L (ref 11–51)

## 2021-01-07 LAB — PREGNANCY, URINE: Preg Test, Ur: NEGATIVE

## 2021-01-07 MED ORDER — LIDOCAINE VISCOUS HCL 2 % MT SOLN
15.0000 mL | Freq: Once | OROMUCOSAL | Status: AC
  Administered 2021-01-07: 01:00:00 15 mL via ORAL
  Filled 2021-01-07: qty 15

## 2021-01-07 MED ORDER — PANTOPRAZOLE SODIUM 40 MG IV SOLR
40.0000 mg | Freq: Once | INTRAVENOUS | Status: AC
  Administered 2021-01-07: 01:00:00 40 mg via INTRAVENOUS
  Filled 2021-01-07: qty 40

## 2021-01-07 MED ORDER — SODIUM CHLORIDE 0.9 % IV BOLUS
1000.0000 mL | Freq: Once | INTRAVENOUS | Status: AC
  Administered 2021-01-07: 01:00:00 1000 mL via INTRAVENOUS

## 2021-01-07 MED ORDER — ALUM & MAG HYDROXIDE-SIMETH 200-200-20 MG/5ML PO SUSP
30.0000 mL | Freq: Once | ORAL | Status: AC
  Administered 2021-01-07: 01:00:00 30 mL via ORAL
  Filled 2021-01-07: qty 30

## 2021-01-07 MED ORDER — ONDANSETRON 4 MG PO TBDP: 4.0000 mg | ORAL_TABLET | Freq: Three times a day (TID) | ORAL | 0 refills | Status: DC | PRN

## 2021-01-07 MED ORDER — MORPHINE SULFATE (PF) 4 MG/ML IV SOLN
4.0000 mg | Freq: Once | INTRAVENOUS | Status: AC
  Administered 2021-01-07: 02:00:00 4 mg via INTRAVENOUS
  Filled 2021-01-07: qty 1

## 2021-01-07 MED ORDER — ONDANSETRON HCL 4 MG/2ML IJ SOLN
4.0000 mg | Freq: Once | INTRAMUSCULAR | Status: AC
  Administered 2021-01-07: 01:00:00 4 mg via INTRAVENOUS
  Filled 2021-01-07: qty 2

## 2021-01-07 MED ORDER — OMEPRAZOLE 20 MG PO CPDR: 20.0000 mg | DELAYED_RELEASE_CAPSULE | Freq: Every day | ORAL | 2 refills | Status: DC

## 2021-01-07 NOTE — ED Notes (Signed)
PO challenge successful.

## 2021-01-07 NOTE — Discharge Instructions (Addendum)
You were seen today for epigastric pain.  You need to start omeprazole.  Avoid ibuprofen or other anti-inflammatory medications.  Eat a bland diet.  Follow-up with gastroenterology for endoscopy.

## 2021-01-07 NOTE — ED Provider Notes (Signed)
[facility] EMERGENCY DEPT Provider Note   CSN: [id number] Arrival date & time: 01/07/21  5597     History Chief Complaint  Patient presents with  . Emesis    Sherry Hunt is a 45 y.o. female.  HPI     This a 45 year old female with a history of reflux who presents with epigastric pain and nausea and vomiting.  Patient reports onset of pain several hours ago.  She states that she recently has had increased NSAID use because she got COVID-19.  She states that her stomach has begun to bother her.  Over the last 4 hours she has had intense sharp burning pain that radiates to her back.  It is not improved with antacid.  She previously was put on a PPI but is not currently taking that.  She has had 2 episodes of nonbilious, nonbloody emesis.  No diarrhea.  No fevers or sick contacts.  She rates her pain at 10 out of 10.  Past Medical History:  Diagnosis Date  . Endometrial polyp    AND INTRAUTERINE LESIONS  . GERD (gastroesophageal reflux disease)   . Menorrhagia     There are no problems to display for this patient.   Past Surgical History:  Procedure Laterality Date  . DILATATION & CURETTAGE/HYSTEROSCOPY WITH MYOSURE N/A 02/11/2017   Procedure: DILATATION & CURETTAGE/HYSTEROSCOPY WITH MYOSURE;  Surgeon: Princess Bruins, MD;  Location: [facility];  Service: Gynecology;  Laterality: N/A;  request 1:00pm OR time  request one hour  . Liposunction    . REDUCTION MAMMAPLASTY Bilateral 2009   and Liposuction     OB History    Gravida  2   Para  2   Term      Preterm      AB      Living  2     SAB      IAB      Ectopic      Multiple      Live Births              Family History  Problem Relation Age of Onset  . Diabetes Mother   . Hypertension Mother   . Cancer Mother        endometrial  . Cancer Maternal Aunt        COLON  . Stroke Father   . Hypertension Father   . Hyperthyroidism Sister   . Asthma Sister      Social History   Tobacco Use  . Smoking status: Former Smoker    Packs/day: 0.50    Years: 10.00    Pack years: 5.00    Types: Cigarettes    Quit date: 01/01/2019    Years since quitting: 2.0  . Smokeless tobacco: Never Used  . Tobacco comment: 10 CIG. PER DAY. Restarted smoking 06/2019  Vaping Use  . Vaping Use: Never used  Substance Use Topics  . Alcohol use: Yes    Comment: OCCASIONAL  . Drug use: No    Home Medications Prior to Admission medications   Medication Sig Start Date End Date Taking? Authorizing Provider  JUNEL FE 1.5/30 1.5-30 MG-MCG tablet TAKE 1 TABLET BY MOUTH EVERY DAY 05/27/20   Princess Bruins, MD  omeprazole (PRILOSEC) 20 MG capsule Take 1 capsule (20 mg total) by mouth daily. 01/07/21  Yes Dehlia Kilner, Barbette Hair, MD  ondansetron (ZOFRAN ODT) 4 MG disintegrating tablet Take 1 tablet (4 mg total) by mouth every 8 (eight) hours as needed  for nausea or vomiting. 01/07/21  Yes Alta Goding, Barbette Hair, MD  pantoprazole (PROTONIX) 20 MG tablet Take 1 tablet (20 mg total) by mouth daily. 07/08/20   Larene Pickett, PA-C  sucralfate (CARAFATE) 1 g tablet Take 1 tablet (1 g total) by mouth 4 (four) times daily -  with meals and at bedtime. 07/08/20   Larene Pickett, PA-C    Allergies    Iodine  Review of Systems   Review of Systems  Constitutional: Negative for fever.  Respiratory: Negative for shortness of breath.   Cardiovascular: Negative for chest pain.  Gastrointestinal: Positive for abdominal pain, nausea and vomiting. Negative for diarrhea.  All other systems reviewed and are negative.   Physical Exam Updated Vital Signs BP (!) 162/97   Pulse 91   Temp 98.2 F (36.8 C) (Oral)   Resp 20   Ht 1.676 m (5\' 6" )   Wt 74.8 kg   LMP 12/24/2020   SpO2 100%   BMI 26.63 kg/m   Physical Exam Vitals and nursing note reviewed.  Constitutional:      Appearance: She is well-developed. She is not ill-appearing.  HENT:     Head: Normocephalic and atraumatic.      Mouth/Throat:     Mouth: Mucous membranes are moist.  Eyes:     Pupils: Pupils are equal, round, and reactive to light.  Cardiovascular:     Rate and Rhythm: Normal rate and regular rhythm.     Heart sounds: Normal heart sounds.  Pulmonary:     Effort: Pulmonary effort is normal. No respiratory distress.     Breath sounds: No wheezing.  Abdominal:     General: Bowel sounds are normal.     Palpations: Abdomen is soft.     Tenderness: There is abdominal tenderness.     Comments: Mild epigastric tenderness palpation, no rebound or guarding  Musculoskeletal:     Cervical back: Neck supple.     Right lower leg: No edema.     Left lower leg: No edema.  Skin:    General: Skin is warm and dry.  Neurological:     Mental Status: She is alert and oriented to person, place, and time.  Psychiatric:        Mood and Affect: Mood normal.     ED Results / Procedures / Treatments   Labs (all labs ordered are listed, but only abnormal results are displayed) Labs Reviewed  CBC WITH DIFFERENTIAL/PLATELET - Abnormal; Notable for the following components:      Result Value   HCT 35.4 (*)    All other components within normal limits  COMPREHENSIVE METABOLIC PANEL - Abnormal; Notable for the following components:   Potassium 5.4 (*)    Glucose, Bld 108 (*)    Alkaline Phosphatase 34 (*)    All other components within normal limits  LIPASE, BLOOD  PREGNANCY, URINE  URINALYSIS, ROUTINE W REFLEX MICROSCOPIC    EKG None  Radiology No results found.  Procedures Procedures   Medications Ordered in ED Medications  sodium chloride 0.9 % bolus 1,000 mL (0 mLs Intravenous Stopped 01/07/21 0202)  ondansetron (ZOFRAN) injection 4 mg (4 mg Intravenous Given 01/07/21 0057)  alum & mag hydroxide-simeth (MAALOX/MYLANTA) 200-200-20 MG/5ML suspension 30 mL (30 mLs Oral Given 01/07/21 0057)    And  lidocaine (XYLOCAINE) 2 % viscous mouth solution 15 mL (15 mLs Oral Given 01/07/21 0058)  pantoprazole  (PROTONIX) injection 40 mg (40 mg Intravenous Given 01/07/21 0057)  morphine  4 MG/ML injection 4 mg (4 mg Intravenous Given 01/07/21 0146)    ED Course  I have reviewed the triage vital signs and the nursing notes.  Pertinent labs & imaging results that were available during my care of the patient were reviewed by me and considered in my medical decision making (see chart for details).    MDM Rules/Calculators/A&P                          Patient presents with epigastric pain.  She is overall nontoxic and vital signs are reassuring.  Minimal tenderness on exam without signs of peritonitis.  She has a history of similar pain in the past with gastritis/GERD.  Other considerations include cholecystitis, pancreatitis.  Patient was given pain and nausea medication.  Lab work obtained.  Notable for normal white count.  No significant metabolic derangements.  Urine pregnancy test negative.  LFTs normal.  Lipase normal arguing against gallbladder pathology and/or pancreatitis.  Patient had gradual improvement of symptoms.  She is able to tolerate fluids.  Discussed with patient reinitiating PPI and avoiding NSAIDs.  Gastroenterology follow-up provided.  After history, exam, and medical workup I feel the patient has been appropriately medically screened and is safe for discharge home. Pertinent diagnoses were discussed with the patient. Patient was given return precautions.  Final Clinical Impression(s) / ED Diagnoses Final diagnoses:  Epigastric pain  Acute gastritis without hemorrhage, unspecified gastritis type    Rx / DC Orders ED Discharge Orders         Ordered    omeprazole (PRILOSEC) 20 MG capsule  Daily        01/07/21 0230    ondansetron (ZOFRAN ODT) 4 MG disintegrating tablet  Every 8 hours PRN        01/07/21 0230           Brynden Thune, Barbette Hair, MD 01/07/21 [phone]

## 2021-01-07 NOTE — ED Triage Notes (Signed)
Pt reports epigastric pain that radiates to her upper back and vomiting  - onset this afternoon

## 2021-01-07 NOTE — ED Notes (Signed)
Patient verbalizes understanding of discharge instructions. Opportunity for questioning and answers were provided. Armband removed by staff, pt discharged from ED. Pt. ambulatory and discharged home.  

## 2021-02-06 ENCOUNTER — Other Ambulatory Visit: Payer: Self-pay | Admitting: Obstetrics & Gynecology

## 2021-02-06 NOTE — Telephone Encounter (Signed)
Patient has not called back for Nexplanon insertion. Called in Oct. 2021 and canceled wanted to continue on pills. Annual exam due in Oct. 2022

## 2021-02-19 ENCOUNTER — Other Ambulatory Visit: Payer: Self-pay | Admitting: Obstetrics and Gynecology

## 2021-09-12 ENCOUNTER — Other Ambulatory Visit: Payer: Self-pay | Admitting: Obstetrics & Gynecology

## 2022-01-05 ENCOUNTER — Emergency Department (HOSPITAL_BASED_OUTPATIENT_CLINIC_OR_DEPARTMENT_OTHER): Payer: Commercial Managed Care - HMO

## 2022-01-05 ENCOUNTER — Other Ambulatory Visit (HOSPITAL_BASED_OUTPATIENT_CLINIC_OR_DEPARTMENT_OTHER): Payer: Self-pay

## 2022-01-05 ENCOUNTER — Other Ambulatory Visit: Payer: Self-pay

## 2022-01-05 ENCOUNTER — Encounter (HOSPITAL_BASED_OUTPATIENT_CLINIC_OR_DEPARTMENT_OTHER): Payer: Self-pay

## 2022-01-05 ENCOUNTER — Emergency Department (HOSPITAL_BASED_OUTPATIENT_CLINIC_OR_DEPARTMENT_OTHER)
Admission: EM | Admit: 2022-01-05 | Discharge: 2022-01-05 | Disposition: A | Discharge status: Home / Self Care | Payer: Commercial Managed Care - HMO | Attending: Emergency Medicine | Admitting: Emergency Medicine

## 2022-01-05 DIAGNOSIS — R1013 Epigastric pain: Secondary | ICD-10-CM | POA: Insufficient documentation

## 2022-01-05 DIAGNOSIS — K2900 Acute gastritis without bleeding: Secondary | ICD-10-CM

## 2022-01-05 DIAGNOSIS — R112 Nausea with vomiting, unspecified: Secondary | ICD-10-CM | POA: Diagnosis not present

## 2022-01-05 DIAGNOSIS — K5792 Diverticulitis of intestine, part unspecified, without perforation or abscess without bleeding: Secondary | ICD-10-CM

## 2022-01-05 LAB — URINALYSIS, ROUTINE W REFLEX MICROSCOPIC
Bilirubin Urine: NEGATIVE
Glucose, UA: NEGATIVE mg/dL
Ketones, ur: NEGATIVE mg/dL
Leukocytes,Ua: NEGATIVE
Nitrite: NEGATIVE
Specific Gravity, Urine: 1.025 (ref 1.005–1.030)
pH: 5 (ref 5.0–8.0)

## 2022-01-05 LAB — COMPREHENSIVE METABOLIC PANEL
ALT: 10 U/L (ref 0–44)
AST: 12 U/L — ABNORMAL LOW (ref 15–41)
Albumin: 4.2 g/dL (ref 3.5–5.0)
Alkaline Phosphatase: 42 U/L (ref 38–126)
Anion gap: 10 (ref 5–15)
BUN: 16 mg/dL (ref 6–20)
CO2: 23 mmol/L (ref 22–32)
Calcium: 10.8 mg/dL — ABNORMAL HIGH (ref 8.9–10.3)
Chloride: 103 mmol/L (ref 98–111)
Creatinine, Ser: 1.19 mg/dL — ABNORMAL HIGH (ref 0.44–1.00)
GFR, Estimated: 57 mL/min — ABNORMAL LOW (ref >60)
Glucose, Bld: 108 mg/dL — ABNORMAL HIGH (ref 70–99)
Potassium: 4.1 mmol/L (ref 3.5–5.1)
Sodium: 136 mmol/L (ref 135–145)
Total Bilirubin: 0.3 mg/dL (ref 0.3–1.2)
Total Protein: 7.6 g/dL (ref 6.5–8.1)

## 2022-01-05 LAB — CBC
HCT: 40.2 % (ref 36.0–46.0)
Hemoglobin: 13.4 g/dL (ref 12.0–15.0)
MCH: 28.9 pg (ref 26.0–34.0)
MCHC: 33.3 g/dL (ref 30.0–36.0)
MCV: 86.8 fL (ref 80.0–100.0)
Platelets: 258 10*3/uL (ref 150–400)
RBC: 4.63 MIL/uL (ref 3.87–5.11)
RDW: 14.2 % (ref 11.5–15.5)
WBC: 10.1 10*3/uL (ref 4.0–10.5)
nRBC: 0 % (ref 0.0–0.2)

## 2022-01-05 LAB — LIPASE, BLOOD: Lipase: 18 U/L (ref 11–51)

## 2022-01-05 LAB — PREGNANCY, URINE: Preg Test, Ur: NEGATIVE

## 2022-01-05 MED ORDER — IOHEXOL 300 MG/ML  SOLN
100.0000 mL | Freq: Once | INTRAMUSCULAR | Status: AC | PRN
  Administered 2022-01-05: 80 mL via INTRAVENOUS

## 2022-01-05 MED ORDER — METRONIDAZOLE 500 MG PO TABS
500.0000 mg | ORAL_TABLET | Freq: Two times a day (BID) | ORAL | 0 refills | Status: DC
  Filled 2022-01-05: qty 14, 7d supply, fill #0

## 2022-01-05 MED ORDER — MORPHINE SULFATE (PF) 4 MG/ML IV SOLN
4.0000 mg | Freq: Once | INTRAVENOUS | Status: DC
  Filled 2022-01-05: qty 1

## 2022-01-05 MED ORDER — CIPROFLOXACIN HCL 500 MG PO TABS
500.0000 mg | ORAL_TABLET | Freq: Two times a day (BID) | ORAL | 0 refills | Status: DC
  Filled 2022-01-05: qty 14, 7d supply, fill #0

## 2022-01-05 MED ORDER — CIPROFLOXACIN HCL 500 MG PO TABS
500.0000 mg | ORAL_TABLET | Freq: Once | ORAL | Status: AC
  Administered 2022-01-05: 500 mg via ORAL
  Filled 2022-01-05: qty 1

## 2022-01-05 MED ORDER — FENTANYL CITRATE PF 50 MCG/ML IJ SOSY
50.0000 ug | PREFILLED_SYRINGE | Freq: Once | INTRAMUSCULAR | Status: AC
  Administered 2022-01-05: 50 ug via INTRAVENOUS
  Filled 2022-01-05: qty 1

## 2022-01-05 MED ORDER — PANTOPRAZOLE SODIUM 40 MG IV SOLR
40.0000 mg | Freq: Once | INTRAVENOUS | Status: AC
  Administered 2022-01-05: 40 mg via INTRAVENOUS
  Filled 2022-01-05: qty 10

## 2022-01-05 MED ORDER — SODIUM CHLORIDE 0.9 % IV BOLUS
1000.0000 mL | Freq: Once | INTRAVENOUS | Status: AC
  Administered 2022-01-05: 1000 mL via INTRAVENOUS

## 2022-01-05 MED ORDER — SUCRALFATE 1 G PO TABS
1.0000 g | ORAL_TABLET | Freq: Three times a day (TID) | ORAL | 0 refills | Status: DC
  Filled 2022-01-05: qty 12, 3d supply, fill #0

## 2022-01-05 MED ORDER — METRONIDAZOLE 500 MG PO TABS
500.0000 mg | ORAL_TABLET | Freq: Once | ORAL | Status: AC
  Administered 2022-01-05: 500 mg via ORAL
  Filled 2022-01-05: qty 1

## 2022-01-05 MED ORDER — PANTOPRAZOLE SODIUM 20 MG PO TBEC
20.0000 mg | DELAYED_RELEASE_TABLET | Freq: Every day | ORAL | 0 refills | Status: DC
  Filled 2022-01-05: qty 30, 30d supply, fill #0

## 2022-01-05 MED ORDER — ONDANSETRON HCL 4 MG/2ML IJ SOLN
4.0000 mg | Freq: Once | INTRAMUSCULAR | Status: AC
  Administered 2022-01-05: 4 mg via INTRAVENOUS
  Filled 2022-01-05: qty 2

## 2022-01-05 NOTE — ED Notes (Signed)
IV infiltrated. Fluids stopped and new line started

## 2022-01-05 NOTE — ED Notes (Signed)
Infiltration initially caught soon after fluids had  restarted when first documented. Pt stated that arm still a little sore but declined ice pack. Site looked clean, intact and no observable swelling.

## 2022-01-05 NOTE — ED Triage Notes (Signed)
Upper abdominal heart burn type pain that started around 2200 last night.  Nausea with one episode of vomiting.

## 2022-01-05 NOTE — ED Notes (Signed)
Patient transported to Ultrasound 

## 2022-01-05 NOTE — ED Provider Notes (Signed)
[facility] EMERGENCY DEPT  Provider Note  CSN: [id number] Arrival date & time: 01/05/22 3762  History Chief Complaint  Patient presents with   Abdominal Pain    Sherry Hunt is a 46 y.o. female reports several hours of burning epigastric pain moving into her back, associated with nausea/vomiting. She has been worried and under stress recently due to family illness. She denies any prior surgeries. Denies EtOH or NSAID use. She was seen for similar about a year ago, diagnosed with gastritis and has been doing well since then until tonight.    Home Medications Prior to Admission medications   Medication Sig Start Date End Date Taking? Authorizing Provider  JUNEL FE 1.5/30 1.5-30 MG-MCG tablet TAKE 1 TABLET BY MOUTH EVERY DAY 02/08/21   Princess Bruins, MD  omeprazole (PRILOSEC) 20 MG capsule Take 1 capsule (20 mg total) by mouth daily. 01/07/21   Horton, Barbette Hair, MD  ondansetron (ZOFRAN ODT) 4 MG disintegrating tablet Take 1 tablet (4 mg total) by mouth every 8 (eight) hours as needed for nausea or vomiting. 01/07/21   Horton, Barbette Hair, MD  pantoprazole (PROTONIX) 20 MG tablet Take 1 tablet (20 mg total) by mouth daily. 07/08/20   Larene Pickett, PA-C  sucralfate (CARAFATE) 1 g tablet Take 1 tablet (1 g total) by mouth 4 (four) times daily -  with meals and at bedtime. 07/08/20   Larene Pickett, PA-C     Allergies    Iodine   Review of Systems   Review of Systems Please see HPI for pertinent positives and negatives  Physical Exam BP (!) 163/97 (BP Location: Right Arm)   Pulse 88   Temp 97.9 F (36.6 C) (Oral)   Resp 20   Ht '5\' 2"'$  (1.575 m)   Wt 71.7 kg   LMP 12/09/2021   SpO2 96%   BMI 28.90 kg/m   Physical Exam Vitals and nursing note reviewed.  Constitutional:      Appearance: Normal appearance.  HENT:     Head: Normocephalic and atraumatic.     Nose: Nose normal.     Mouth/Throat:     Mouth: Mucous membranes are moist.  Eyes:      Extraocular Movements: Extraocular movements intact.     Conjunctiva/sclera: Conjunctivae normal.  Cardiovascular:     Rate and Rhythm: Normal rate.  Pulmonary:     Effort: Pulmonary effort is normal.     Breath sounds: Normal breath sounds.  Abdominal:     General: Abdomen is flat.     Palpations: Abdomen is soft.     Tenderness: There is abdominal tenderness in the epigastric area. There is no guarding. Negative signs include Murphy's sign and McBurney's sign.  Musculoskeletal:        General: No swelling. Normal range of motion.     Cervical back: Neck supple.  Skin:    General: Skin is warm and dry.  Neurological:     General: No focal deficit present.     Mental Status: She is alert.  Psychiatric:        Mood and Affect: Mood normal.    ED Results / Procedures / Treatments   EKG None  Procedures Procedures  Medications Ordered in the ED Medications  ondansetron (ZOFRAN) injection 4 mg (has no administration in time range)  pantoprazole (PROTONIX) injection 40 mg (has no administration in time range)  fentaNYL (SUBLIMAZE) injection 50 mcg (has no administration in time range)    Initial Impression and  Plan  Patient with epigastric pain, similar to prior GERD/gastritis but also consider cholecystitis, cholelithiasis, pancreatitis, hepatitis, PUD. Will check labs, pain/nausea meds in the meantime for comfort.   ED Course   Clinical Course as of 01/05/22 0658  Tue Jan 05, 2022  0613 CBC is normal.  [CS]  0620 HCG is neg [CS]  0626 UA is clear.  [CS]  [phone] Care of the patient signed out to Dr. Gilford Raid at the change of shift.  [CS]    Clinical Course User Index [CS] Truddie Hidden, MD     MDM Rules/Calculators/A&P Medical Decision Making Problems Addressed: Epigastric pain: acute illness or injury  Amount and/or Complexity of Data Reviewed Labs: ordered. Decision-making details documented in ED Course.  Risk Prescription drug management. Parenteral  controlled substances.    Final Clinical Impression(s) / ED Diagnoses Final diagnoses:  None    Rx / DC Orders ED Discharge Orders     None        Truddie Hidden, MD 01/05/22 [phone]

## 2022-01-05 NOTE — ED Provider Notes (Signed)
Pt was signed out by Dr. Karle Starch pending symptomatic improvement.  Labs were all unremarkable.  Pt still had a 7/10 pain, so I ordered a CT abd/pelvis.  CT scans were reviewed by me. I agree with the radiologist.    IMPRESSION:  1. Extensive colonic diverticulosis with findings of early acute  uncomplicated sigmoid diverticulitis.  2. Moderately distended gallbladder with suggestion of diffuse wall  thickening and trace pericholecystic fluid. No hyperdense gallstone.  If there is clinical concern for acute cholecystitis, further  evaluation with right upper quadrant ultrasound is recommended.  3. Fibroid uterus.  4. Incidentally noted 3 mm noncalcified nodule within the left lower  lobe. No follow-up needed if patient is low-risk.This recommendation  follows the consensus statement: Guidelines for Management of  Incidental Pulmonary Nodules Detected on CT Images: From the  Fleischner Society 2017; Radiology 2017; 284:228-243.   Pt does not have any pain in the LLQ.  Pain is mainly in the epigastrium.  Due to the findings on the CT with the abnormal GB, I ordered a RUQ [place].  RUQ [place]:    IMPRESSION:  1. Moderate amount of gallbladder sludge and small gallbladder  polyps.  2. No gallstones or findings for acute cholecystitis.  3. No biliary dilatation.  4. Normal sonographic appearance of the liver.       Scans were reviewed by me. I agree with the radiologist.   Pt did not want any pain medication after the fentanyl as the fentanyl made her feel bad.  Pain has improved and she's ready to go home.  She is d/c with tx for diverticulitis (cipro/flagyl) and is d/c with protonix and carafate for her likely gerd/gastritis.  She is referred to GI.  She is to return if worse.   Isla Pence, MD 01/05/22 1030

## 2022-01-05 NOTE — ED Notes (Signed)
Pt stated that her pain is better and that it is bearable. Through an interpreter pt did not want pain medication

## 2022-01-13 ENCOUNTER — Ambulatory Visit (INDEPENDENT_AMBULATORY_CARE_PROVIDER_SITE_OTHER): Payer: Commercial Managed Care - HMO | Admitting: Obstetrics & Gynecology

## 2022-01-13 ENCOUNTER — Encounter: Payer: Self-pay | Admitting: Obstetrics & Gynecology

## 2022-01-13 VITALS — BP 114/70 | HR 70 | Resp 16 | Ht 62.75 in | Wt 155.0 lb

## 2022-01-13 DIAGNOSIS — Z01419 Encounter for gynecological examination (general) (routine) without abnormal findings: Secondary | ICD-10-CM | POA: Diagnosis not present

## 2022-01-13 DIAGNOSIS — Z3041 Encounter for surveillance of contraceptive pills: Secondary | ICD-10-CM

## 2022-01-13 NOTE — Progress Notes (Unsigned)
    Sherry Hunt [birth year] [id number]   History:    46 y.o. G2P2L2 Married   RP:  Established patient presenting for annual gyn exam    HPI: On Junel Fe 1.5/30 with severe breast tenderness while on the active pills.  No breast lump felt.  H/O bilateral breast reduction.  No breakthrough bleeding.  No pelvic pain.  No pain with intercourse.  Normal vaginal secretions.  Urine and bowel movements normal.  Body mass index 28.02.  Needs to increase physical activities. Good nutrition.    p 05-15-20, m bilateral & rt breast 12-20-19  Past medical history,surgical history, family history and social history were all reviewed and documented in the EPIC chart.  Gynecologic History Patient's last menstrual period was 12/09/2021.  Obstetric History OB History  Gravida Para Term Preterm AB Living  '2 2       2  '$ SAB IAB Ectopic Multiple Live Births               # Outcome Date GA Lbr Len/2nd Weight Sex Delivery Anes PTL Lv  2 Para           1 Para              ROS: A ROS was performed and pertinent positives and negatives are included in the history.  GENERAL: No fevers or chills. HEENT: No change in vision, no earache, sore throat or sinus congestion. NECK: No pain or stiffness. CARDIOVASCULAR: No chest pain or pressure. No palpitations. PULMONARY: No shortness of breath, cough or wheeze. GASTROINTESTINAL: No abdominal pain, nausea, vomiting or diarrhea, melena or bright red blood per rectum. GENITOURINARY: No urinary frequency, urgency, hesitancy or dysuria. MUSCULOSKELETAL: No joint or muscle pain, no back pain, no recent trauma. DERMATOLOGIC: No rash, no itching, no lesions. ENDOCRINE: No polyuria, polydipsia, no heat or cold intolerance. No recent change in weight. HEMATOLOGICAL: No anemia or easy bruising or bleeding. NEUROLOGIC: No headache, seizures, numbness, tingling or weakness. PSYCHIATRIC: No depression, no loss of interest in normal activity or change in sleep pattern.      Exam:   BP 114/70   Pulse 70   Resp 16   Ht 5' 2.75" (1.594 m)   Wt 155 lb (70.3 kg)   LMP 12/09/2021   BMI 27.68 kg/m   Body mass index is 27.68 kg/m.  General appearance : Well developed well nourished female. No acute distress HEENT: Eyes: no retinal hemorrhage or exudates,  Neck supple, trachea midline, no carotid bruits, no thyroidmegaly Lungs: Clear to auscultation, no rhonchi or wheezes, or rib retractions  Heart: Regular rate and rhythm, no murmurs or gallops Breast:Examined in sitting and supine position were symmetrical in appearance, no palpable masses or tenderness,  no skin retraction, no nipple inversion, no nipple discharge, no skin discoloration, no axillary or supraclavicular lymphadenopathy Abdomen: no palpable masses or tenderness, no rebound or guarding Extremities: no edema or skin discoloration or tenderness  Pelvic: Vulva: Normal             Vagina: No gross lesions or discharge  Cervix: No gross lesions or discharge  Uterus  ***, normal size, shape and consistency, non-tender and mobile  Adnexa  Without masses or tenderness  Anus: ***   Assessment/Plan:  46 y.o. female for annual exam   ***  Princess Bruins MD, 3:46 PM 01/13/2022

## 2022-01-14 ENCOUNTER — Other Ambulatory Visit: Payer: Self-pay | Admitting: Obstetrics & Gynecology

## 2022-01-15 ENCOUNTER — Telehealth: Payer: Commercial Managed Care - HMO | Admitting: Physician Assistant

## 2022-01-15 DIAGNOSIS — T3695XA Adverse effect of unspecified systemic antibiotic, initial encounter: Secondary | ICD-10-CM

## 2022-01-15 DIAGNOSIS — R21 Rash and other nonspecific skin eruption: Secondary | ICD-10-CM

## 2022-01-15 DIAGNOSIS — B370 Candidal stomatitis: Secondary | ICD-10-CM | POA: Diagnosis not present

## 2022-01-15 DIAGNOSIS — B379 Candidiasis, unspecified: Secondary | ICD-10-CM | POA: Diagnosis not present

## 2022-01-15 MED ORDER — NYSTATIN 100000 UNIT/ML MT SUSP: 5.0000 mL | Freq: Four times a day (QID) | OROMUCOSAL | 0 refills | Status: DC

## 2022-01-15 MED ORDER — PREDNISONE 20 MG PO TABS: 20.0000 mg | ORAL_TABLET | Freq: Every day | ORAL | 0 refills | Status: DC

## 2022-01-15 MED ORDER — FLUCONAZOLE 150 MG PO TABS: 150.0000 mg | ORAL_TABLET | ORAL | 0 refills | Status: DC | PRN

## 2022-01-15 NOTE — Progress Notes (Signed)
Virtual Visit Consent   Sherry Hunt, you are scheduled for a virtual visit with a [facility] provider today. Just as with appointments in the office, your consent must be obtained to participate. Your consent will be active for this visit and any virtual visit you may have with one of our providers in the next 365 days. If you have a MyChart account, a copy of this consent can be sent to you electronically.  As this is a virtual visit, video technology does not allow for your provider to perform a traditional examination. This may limit your provider's ability to fully assess your condition. If your provider identifies any concerns that need to be evaluated in person or the need to arrange testing (such as labs, EKG, etc.), we will make arrangements to do so. Although advances in technology are sophisticated, we cannot ensure that it will always work on either your end or our end. If the connection with a video visit is poor, the visit may have to be switched to a telephone visit. With either a video or telephone visit, we are not always able to ensure that we have a secure connection.  By engaging in this virtual visit, you consent to the provision of healthcare and authorize for your insurance to be billed (if applicable) for the services provided during this visit. Depending on your insurance coverage, you may receive a charge related to this service.  I need to obtain your verbal consent now. Are you willing to proceed with your visit today? Sherry Hunt has provided verbal consent on 01/15/2022 for a virtual visit (video or telephone). Sherry Daring, PA-C  Date: 01/15/2022 7:15 PM  Virtual Visit via Video Note   I, Sherry Hunt, connected with  Sherry Hunt  ([id number], [birth year]) on 01/15/22 at  7:00 PM EDT by a video-enabled telemedicine application and verified that I am speaking with the correct person using two  identifiers.  [place] used ID# [id number]  Location: Patient: Virtual Visit Location Patient: Home Provider: Virtual Visit Location Provider: Home Office   I discussed the limitations of evaluation and management by telemedicine and the availability of in person appointments. The patient expressed understanding and agreed to proceed.    History of Present Illness: Sherry Hunt is a 46 y.o. who identifies as a female who was assigned female at birth, and is being seen today for mouth and lips are sore. Recently treated with Sucralfate, Cipro and Flagyl for Diverticulitis and gastritis on 01/05/22. She reports she noticed after completing the antibiotics she started having a sore mouth and lips. This started yesterday. Today she noticed her lips were a little swollen and red. Mouth was more sore. She then noticed a rash on her chest. She reports the rash does not itch or hurt. She denies shortness or breath or difficulty breathing. Does have pain with swallowing but able to maintain her own saliva and drink fluids. She is also having vaginal irritation and itching/burning that radiates to the perineal area.    Problems: There are no problems to display for this patient.   Allergies:  Allergies  Allergen Reactions   Iodine Other (See Comments)    "iodine on skin causes irritation"   Medications:  Current Outpatient Medications:    fluconazole (DIFLUCAN) 150 MG tablet, Take 1 tablet (150 mg total) by mouth every 3 (three) days as needed., Disp: 2 tablet, Rfl: 0   nystatin (MYCOSTATIN) 100000 UNIT/ML suspension, Take 5 mLs (500,000  Units total) by mouth 4 (four) times daily., Disp: 120 mL, Rfl: 0   predniSONE (DELTASONE) 20 MG tablet, Take 1 tablet (20 mg total) by mouth daily with breakfast., Disp: 5 tablet, Rfl: 0   pantoprazole (PROTONIX) 20 MG tablet, Take 1 tablet (20 mg total) by mouth daily., Disp: 30 tablet, Rfl: 0  Observations/Objective: Patient is  well-developed, well-nourished in no acute distress.  Resting comfortably at home.  Head is normocephalic, atraumatic.  No labored breathing.  Speech is clear and coherent with logical content.  Patient is alert and oriented at baseline.    Assessment and Plan: 1. Thrush - nystatin (MYCOSTATIN) 100000 UNIT/ML suspension; Take 5 mLs (500,000 Units total) by mouth 4 (four) times daily.  Dispense: 120 mL; Refill: 0  2. Antibiotic-induced yeast infection - fluconazole (DIFLUCAN) 150 MG tablet; Take 1 tablet (150 mg total) by mouth every 3 (three) days as needed.  Dispense: 2 tablet; Refill: 0  3. Rash and nonspecific skin eruption - predniSONE (DELTASONE) 20 MG tablet; Take 1 tablet (20 mg total) by mouth daily with breakfast.  Dispense: 5 tablet; Refill: 0  - Suspect antibiotic induced thrush and vaginal yeast infection.  - Nystatin for thrush - Diflucan for vaginal yeast infection - Prednisone prescribed for rash and swelling - Seek immediate in person evaluation if swelling worsens or if she develops difficulty breathing or shortness of breath  Follow Up Instructions: I discussed the assessment and treatment plan with the patient. The patient was provided an opportunity to ask questions and all were answered. The patient agreed with the plan and demonstrated an understanding of the instructions.  A copy of instructions were sent to the patient via MyChart unless otherwise noted below.    The patient was advised to call back or seek an in-person evaluation if the symptoms worsen or if the condition fails to improve as anticipated.  Time:  I spent 18 minutes with the patient via telehealth technology discussing the above problems/concerns.    Sherry Daring, PA-C

## 2022-01-15 NOTE — Patient Instructions (Signed)
Ancil Linsey Daiva Eves, thank you for joining Mar Daring, PA-C for today's virtual visit.  While this provider is not your primary care provider (PCP), if your PCP is located in our provider database this encounter information will be shared with them immediately following your visit.  Consent: (Patient) Sherry Hunt provided verbal consent for this virtual visit at the beginning of the encounter.  Current Medications:  Current Outpatient Medications:    fluconazole (DIFLUCAN) 150 MG tablet, Take 1 tablet (150 mg total) by mouth every 3 (three) days as needed., Disp: 2 tablet, Rfl: 0   nystatin (MYCOSTATIN) 100000 UNIT/ML suspension, Take 5 mLs (500,000 Units total) by mouth 4 (four) times daily., Disp: 120 mL, Rfl: 0   predniSONE (DELTASONE) 20 MG tablet, Take 1 tablet (20 mg total) by mouth daily with breakfast., Disp: 5 tablet, Rfl: 0   pantoprazole (PROTONIX) 20 MG tablet, Take 1 tablet (20 mg total) by mouth daily., Disp: 30 tablet, Rfl: 0   Medications ordered in this encounter:  Meds ordered this encounter  Medications   nystatin (MYCOSTATIN) 100000 UNIT/ML suspension    Sig: Take 5 mLs (500,000 Units total) by mouth 4 (four) times daily.    Dispense:  120 mL    Refill:  0    Order Specific Question:   Supervising Provider    Answer:   MILLER, BRIAN [[id number]]   fluconazole (DIFLUCAN) 150 MG tablet    Sig: Take 1 tablet (150 mg total) by mouth every 3 (three) days as needed.    Dispense:  2 tablet    Refill:  0    Order Specific Question:   Supervising Provider    Answer:   MILLER, BRIAN [[id number]]   predniSONE (DELTASONE) 20 MG tablet    Sig: Take 1 tablet (20 mg total) by mouth daily with breakfast.    Dispense:  5 tablet    Refill:  0    Order Specific Question:   Supervising Provider    Answer:   Sabra Heck, BRIAN [[id number]]     *If you need refills on other medications prior to your next appointment, please contact your pharmacy*  Follow-Up: Call back  or seek an in-person evaluation if the symptoms worsen or if the condition fails to improve as anticipated.  Other Instructions Candidiasis bucal en los adultos Oral Thrush, Adult La candidiasis bucal es una infeccin por hongos que aparece en la boca y en la garganta, y sobre la [place]. Causa la formacin de manchas blancas en la boca y en la lengua. Muchos casos de candidiasis son leves, pero esta infeccin puede ser grave. La candidiasis puede ser un problema que se repite (recurrente) en ciertas personas que tienen un sistema de defensa del cuerpo (sistema inmunitario) dbil. La debilidad puede ser causada por una enfermedad crnica o por tomar medicamentos que limitan la capacidad del cuerpo para combatir las infecciones. Si una persona tiene dificultades para combatir infecciones, los hongos que provocan la candidiasis pueden expandirse a todo el cuerpo. Esto puede causar infecciones potencialmente mortales en la sangre o los rganos. Cules son las causas? La causa de esta afeccin es un hongo (levadura) llamado Candida albicans. Por lo general, este hongo est presente en pequeas cantidades en la boca y en otras membranas mucosas. A menudo no causa ningn dao. Cuando se presentan las condiciones para que el hongo crezca sin control, invade los tejidos que lo rodean y se convierte en una infeccin. Otras especies de cndida tambin pueden provocar candidiasis, aunque  esto es poco frecuente. Qu incrementa el riesgo? Los siguientes factores pueden hacer que sea ms propenso a Armed forces training and education officer afeccin: Tener un sistema inmunitario debilitado. Ser un [employer]. Tener diabetes, cncer o VIH (virus de inmunodeficiencia humana). Tener sequedad en la boca [place]). Estar embarazada o amamantando. Tener un cuidado dental deficiente, especialmente en aquellos que tienen dentadura postiza. Usar antibiticos o medicamentos con corticoesteroides. Cules son los signos o sntomas? Los  sntomas de esta afeccin pueden variar de leves y moderados a graves y crnicos. Entre los sntomas, se pueden incluir los siguientes: Sensacin de ardor en la boca y Patent examiner. Esto puede producirse al comienzo de una infeccin por candidiasis. Manchas blancas en la boca y la lengua. El tejido que rodea estas manchas puede ser rojo, estar en carne viva y ser doloroso. Si se frota (por ejemplo, al lavarse los dientes) las manchas y el tejido de la boca, estos pueden Publishing rights manager. Mal sabor en la boca o dificultad para sentirles el gusto a los alimentos. Sensacin de Consulting civil engineer. Dolor al comer y al tragar. Falta de apetito. Grietas en las comisuras de la boca. Cmo se diagnostica? Esta afeccin se diagnostica en funcin de lo siguiente: Un examen fsico. Sus antecedentes mdicos. Cmo se trata? Esta afeccin se trata con medicamentos llamados antimicticos, que impiden el crecimiento de los hongos. Estos medicamentos pueden aplicarse directamente en la zona afectada (tpicos) o tragarse (orales). El tratamiento depende de la gravedad de la enfermedad. Los casos leves de candidiasis pueden tratarse con una pastilla o enjuague bucal con accin antimictica. El tratamiento por lo general dura unos 14 das. Los casos de candidiasis moderada a grave pueden tratarse con medicamentos antimicticos por va oral, si se ha diseminado al esfago. Tambin se puede Engineer, materials antimictico tpico. Para algunas infecciones graves, puede ser necesario continuar el tratamiento por ms de 14 das. Los antimicticos orales no suelen usarse durante el [place], ya que pueden ser perjudiciales para el beb en gestacin. Si est embarazada, hable con el mdico sobre las opciones de [place]. Candidiasis crnica o recurrente. En algunos casos en que la candidiasis no desaparece o es recurrente: Es posible que se necesite un tratamiento doble mientras los sntomas continen. El  tratamiento incluir medicamentos antimicticos de administracin oral y tpica. Las personas cuyo sistema inmunitario est debilitado pueden tomar medicamentos antimicticos de forma continuada para prevenir las infecciones por candidiasis. Es importante tratar las enfermedades que aumentan las probabilidades de que una persona contraiga candidiasis, como la diabetes o el VIH. Siga estas instrucciones en su casa: Alivio del dolor y de las molestias Para ayudar a reducir las molestias causadas por la candidiasis: Beba lquidos fros, como agua o t helado. Tambin puede probar con helados o jugos congelados. Coma alimentos fciles de tragar, como gelatinas, helados o natillas. Si las USAA boca son dolorosas, pruebe beber con un sorbete.  Instrucciones generales [place] o use los medicamentos de venta libre y los recetados solamente como se lo haya indicado el mdico. Coma yogur natural entero como se lo haya indicado el mdico. Compruebe en la etiqueta que el yogur contenga cultivos vivos. Este yogur puede ayudar a que algunas bacterias saludables crezcan en la boca y Proofreader crecimiento del hongo que causa la candidiasis. Si [place] una dentadura postiza, qutesela antes de dormir, cepllela vigorosamente y pngala en remojo en una solucin de limpieza como se lo haya indicado el mdico. Enjuguese la boca con una mezcla de [place]  tibia y sal varias veces al da. Para preparar la mezcla de agua y sal, disuelva de  a 1 cucharadita (de 3 a 6 g) de sal en 1 taza (237 ml) de agua tibia. Comunquese con un mdico si: Los sntomas no mejoran en el plazo de los 7 das a partir del inicio del [place], o si [place]olph. Tiene sntomas de que la infeccin se ha propagado, Risk manager en la piel afuera de la boca. Est amamantando a un beb y tiene enrojecimiento y Social research officer, government en los pezones. Resumen La candidiasis bucal es una infeccin por hongos que aparece en la boca y en la garganta, y sobre la  [place]. Causa la formacin de manchas blancas en la boca y en la lengua. Es ms probable que Retail buyer afeccin si tiene el sistema inmunitario debilitado o una enfermedad preexistente, como VIH, cncer o diabetes. Esta afeccin se trata con medicamentos llamados antimicticos, que impiden el crecimiento de los hongos. Comunquese con un mdico si sus sntomas no mejoran, o empeoran, en el plazo de 7 das despus de [employer]. Esta informacin no tiene Marine scientist el consejo del mdico. Asegrese de hacerle al mdico cualquier pregunta que tenga. Document Revised: 04/03/2021 Document Reviewed: 07/31/2019 Elsevier Patient Education  [place] vaginal en mujeres adultas Vaginal Yeast Infection, Adult  La infeccin mictica vaginal es una afeccin que causa secrecin vaginal y Garment/textile technologist, hinchazn y enrojecimiento (inflamacin) de la vagina. Esta es una afeccin frecuente. Algunas mujeres contraen esta infeccin con frecuencia. Cules son las causas? La causa de la infeccin es un cambio en el equilibrio normal de las levaduras (cndida) y las bacterias normales que viven en la vagina. Esta alteracin deriva en el crecimiento excesivo de las levaduras, lo que causa la inflamacin. Qu incrementa el riesgo? Es ms probable que esta afeccin ocurra en las mujeres que: Toman antibiticos. Tienen diabetes. Toman anticonceptivos orales. Estn embarazadas. Se hacen duchas vaginales con frecuencia. Tienen debilitado el sistema de defensa del organismo (sistema inmunitario). Han estado tomando medicamentos con corticoesteroides durante [employer]. Usan ropa ajustada con frecuencia. Cules son los signos o sntomas? Los sntomas de esta afeccin incluyen: Secrecin vaginal blanca, cremosa y espesa. Hinchazn, picazn, enrojecimiento e irritacin de la vagina. Los labios de la vagina (labios de la vulva) tambin pueden  infectarse. Dolor o ardor al Garment/textile technologist. [place]. Cmo se diagnostica? Esta afeccin se diagnostica en funcin de lo siguiente: Sus antecedentes mdicos. Un examen fsico. Un examen plvico. El mdico examinar una muestra de la secrecin vaginal con un microscopio. Probablemente el mdico enve esta muestra al laboratorio para analizarla y confirmar el diagnstico. Cmo se trata? Esta afeccin se trata con medicamentos. Los Dynegy pueden ser recetados o de venta libre. Podrn indicarle que use uno o ms de lo siguiente: Medicamentos que se toman por boca (orales). Medicamentos que se aplican como una crema (tpicos). Medicamentos que se colocan directamente en la vagina (vulos vaginales). Siga estas indicaciones en su casa: Use o aplquese los medicamentos de venta libre y los recetados solamente como se lo haya indicado el mdico. No use tampones hasta que el mdico la autorice. No tenga sexo hasta que la infeccin haya desaparecido. El sexo puede prolongar o empeorar sus sntomas de infeccin. Pregntele al mdico cundo es seguro reanudar la actividad sexual. Dallie Dad a todas las visitas de seguimiento. Esto es importante. Cmo se evita?  No use ropa ajustada, como pantimedias o pantalones ajustados. Use  ropa interior de algodn, que permite el paso del aire. No use duchas vaginales, jabn perfumado, cremas ni talcos. Cuando vaya al bao, siempre higiencese de adelante [employer]. Si tiene diabetes, mantenga bajo control los niveles de Dispensing optician. Pregntele al mdico otras maneras de prevenir las infecciones por levaduras. Comunquese con un mdico si: Tiene fiebre. Los sntomas desaparecen y luego vuelven a Arts administrator. Los sntomas no mejoran con Dispensing optician. Sus sntomas empeoran. Aparecen nuevos sntomas. Aparecen ampollas alrededor o adentro de la vagina. Le sale sangre de la vagina y no est menstruando. Siente dolor en el  abdomen. Resumen La infeccin mictica vaginal es una afeccin que causa secrecin y Garment/textile technologist, hinchazn y enrojecimiento (inflamacin) de la vagina. Esta afeccin se trata con medicamentos. Los Dynegy pueden ser recetados o de venta libre. Use o aplquese los medicamentos de venta libre y los recetados solamente como se lo haya indicado el mdico. No se haga duchas vaginales. Reanude la actividad sexual u use tampones como se lo haya indicado el mdico. Comunquese con un mdico si los sntomas no mejoran con el tratamiento o si los sntomas desaparecen y Surveyor, mining. Esta informacin no tiene Marine scientist el consejo del mdico. Asegrese de hacerle al mdico cualquier pregunta que tenga. Document Revised: 11/04/2020 Document Reviewed: 11/04/2020 Elsevier Patient Education  [place].    If you have been instructed to have an in-person evaluation today at a local Urgent Care facility, please use the link below. It will take you to a list of all of our available [facility], including address, phone number and hours of operation. Please do not delay care.  [facility]  If you or a family member do not have a primary care provider, use the link below to schedule a visit and establish care. When you choose a [facility] primary care physician or advanced practice provider, you gain a long-term partner in health. Find a Primary Care Provider  Learn more about [facility]'s in-office and virtual care options: [facility]

## 2022-01-15 NOTE — Telephone Encounter (Signed)
Annual exam was 01/13/22

## 2022-01-20 ENCOUNTER — Telehealth: Payer: Commercial Managed Care - HMO | Admitting: Physician Assistant

## 2022-01-20 DIAGNOSIS — L239 Allergic contact dermatitis, unspecified cause: Secondary | ICD-10-CM

## 2022-01-20 MED ORDER — PREDNISONE 10 MG PO TABS: ORAL_TABLET | ORAL | 0 refills | Status: AC

## 2022-01-20 MED ORDER — HYDROXYZINE PAMOATE 25 MG PO CAPS: 25.0000 mg | ORAL_CAPSULE | Freq: Three times a day (TID) | ORAL | 0 refills | Status: DC | PRN

## 2022-01-20 NOTE — Progress Notes (Signed)
Virtual Visit Consent   Sherry Hunt, you are scheduled for a virtual visit with a [facility] provider today. Just as with appointments in the office, your consent must be obtained to participate. Your consent will be active for this visit and any virtual visit you may have with one of our providers in the next 365 days. If you have a MyChart account, a copy of this consent can be sent to you electronically.  As this is a virtual visit, video technology does not allow for your provider to perform a traditional examination. This may limit your provider's ability to fully assess your condition. If your provider identifies any concerns that need to be evaluated in person or the need to arrange testing (such as labs, EKG, etc.), we will make arrangements to do so. Although advances in technology are sophisticated, we cannot ensure that it will always work on either your end or our end. If the connection with a video visit is poor, the visit may have to be switched to a telephone visit. With either a video or telephone visit, we are not always able to ensure that we have a secure connection.  By engaging in this virtual visit, you consent to the provision of healthcare and authorize for your insurance to be billed (if applicable) for the services provided during this visit. Depending on your insurance coverage, you may receive a charge related to this service.  I need to obtain your verbal consent now. Are you willing to proceed with your visit today? Sherry Hunt has provided verbal consent on 01/20/2022 for a virtual visit (video or telephone). Leeanne Rio, [place]  Date: 01/20/2022 8:57 AM  Virtual Visit via Video Note   I, Leeanne Rio, connected with  Sherry Hunt  ([id number], [birth year]) on 01/20/22 at  8:15 AM EDT by a video-enabled telemedicine application and verified that I am speaking with the correct person using two  identifiers.  Location: Patient: Virtual Visit Location Patient: Home Provider: Virtual Visit Location Provider: Home Office  Spanish Interpretor present for entirety of visit Interpretor: [phone]   I discussed the limitations of evaluation and management by telemedicine and the availability of in person appointments. The patient expressed understanding and agreed to proceed.    History of Present Illness: Sherry Hunt is a 46 y.o. who identifies as a female who was assigned female at birth, and is being seen today for recurrence of rash after completion of 5 days of steroid. Patient initially seen via video on 6/15 noting symptoms or potential thrush after being on course of Cipro and Flagyl for diverticulitis. Was started on Diflucan for that but also noted to have a dermatitis so was given 5-days of 20 mf prednisone. Notes this helped with rash considerably but within 24 hours after stopping antibiotic, the rash came back and is spread over her neck, chest, abdomen, back, arms and legs. Is prurituc but not painful. Denies fever, chills. Notes no recurrence of diverticulitis or thrush symptoms.   HPI: HPI  Problems: There are no problems to display for this patient.   Allergies:  Allergies  Allergen Reactions   Iodine Other (See Comments)    "iodine on skin causes irritation"   Medications:  Current Outpatient Medications:    hydrOXYzine (VISTARIL) 25 MG capsule, Take 1 capsule (25 mg total) by mouth every 8 (eight) hours as needed., Disp: 30 capsule, Rfl: 0   predniSONE (DELTASONE) 10 MG tablet, Take 4 tablets (40 mg total)  by mouth daily with breakfast for 4 days, THEN 3 tablets (30 mg total) daily with breakfast for 4 days, THEN 2 tablets (20 mg total) daily with breakfast for 3 days, THEN 1 tablet (10 mg total) daily with breakfast for 3 days., Disp: 37 tablet, Rfl: 0   JUNEL FE 1.5/30 1.5-30 MG-MCG tablet, TAKE 1 TABLET BY MOUTH EVERY DAY, Disp: 84 tablet, Rfl: 4    pantoprazole (PROTONIX) 20 MG tablet, Take 1 tablet (20 mg total) by mouth daily., Disp: 30 tablet, Rfl: 0  Observations/Objective: Patient is well-developed, well-nourished in no acute distress.  Resting comfortably at home.  Head is normocephalic, atraumatic.  No labored breathing. Speech is clear and coherent with logical content.  Patient is alert and oriented at baseline.  Diffuse maculopapular, erythematous rash noted over neck, chest, arms bilaterally. Other areas unable to be examined via video.   Assessment and Plan: 1. Allergic dermatitis - hydrOXYzine (VISTARIL) 25 MG capsule; Take 1 capsule (25 mg total) by mouth every 8 (eight) hours as needed.  Dispense: 30 capsule; Refill: 0 - predniSONE (DELTASONE) 10 MG tablet; Take 4 tablets (40 mg total) by mouth daily with breakfast for 4 days, THEN 3 tablets (30 mg total) daily with breakfast for 4 days, THEN 2 tablets (20 mg total) daily with breakfast for 3 days, THEN 1 tablet (10 mg total) daily with breakfast for 3 days.  Dispense: 37 tablet; Refill: 0  Likely now with rebound dermatitis due to short steroid course given previously. Extended taper started. Rx hydroxyzine as well for itch. Supportive measures and OTC medications reviewed with patient. If not resolving, or any new/worsening symptoms, she is to seek an in-person evaluation.   Follow Up Instructions: I discussed the assessment and treatment plan with the patient. The patient was provided an opportunity to ask questions and all were answered. The patient agreed with the plan and demonstrated an understanding of the instructions.  A copy of instructions were sent to the patient via MyChart unless otherwise noted below.    The patient was advised to call back or seek an in-person evaluation if the symptoms worsen or if the condition fails to improve as anticipated.  Time:  I spent 15 minutes with the patient via telehealth technology discussing the above problems/concerns.     Leeanne Rio, PA-C

## 2022-01-20 NOTE — Patient Instructions (Signed)
  Ancil Linsey Daiva Eves, thank you for joining Leeanne Rio, PA-C for today's virtual visit.  While this provider is not your primary care provider (PCP), if your PCP is located in our provider database this encounter information will be shared with them immediately following your visit.  Consent: (Patient) Sherry Hunt provided verbal consent for this virtual visit at the beginning of the encounter.  Current Medications:  Current Outpatient Medications:    hydrOXYzine (VISTARIL) 25 MG capsule, Take 1 capsule (25 mg total) by mouth every 8 (eight) hours as needed., Disp: 30 capsule, Rfl: 0   JUNEL FE 1.5/30 1.5-30 MG-MCG tablet, TAKE 1 TABLET BY MOUTH EVERY DAY, Disp: 84 tablet, Rfl: 4   pantoprazole (PROTONIX) 20 MG tablet, Take 1 tablet (20 mg total) by mouth daily., Disp: 30 tablet, Rfl: 0   predniSONE (DELTASONE) 10 MG tablet, Take 4 tablets (40 mg total) by mouth daily with breakfast for 4 days, THEN 3 tablets (30 mg total) daily with breakfast for 4 days, THEN 2 tablets (20 mg total) daily with breakfast for 3 days, THEN 1 tablet (10 mg total) daily with breakfast for 3 days., Disp: 37 tablet, Rfl: 0   Medications ordered in this encounter:  Meds ordered this encounter  Medications   hydrOXYzine (VISTARIL) 25 MG capsule    Sig: Take 1 capsule (25 mg total) by mouth every 8 (eight) hours as needed.    Dispense:  30 capsule    Refill:  0    Order Specific Question:   Supervising Provider    Answer:   Sabra Heck, BRIAN [[id number]]   predniSONE (DELTASONE) 10 MG tablet    Sig: Take 4 tablets (40 mg total) by mouth daily with breakfast for 4 days, THEN 3 tablets (30 mg total) daily with breakfast for 4 days, THEN 2 tablets (20 mg total) daily with breakfast for 3 days, THEN 1 tablet (10 mg total) daily with breakfast for 3 days.    Dispense:  37 tablet    Refill:  0    Order Specific Question:   Supervising Provider    Answer:   Sabra Heck, BRIAN [[id number]]     *If you need  refills on other medications prior to your next appointment, please contact your pharmacy*  Follow-Up: Call back or seek an in-person evaluation if the symptoms worsen or if the condition fails to improve as anticipated.  Other Instructions Please keep skin clean and dry. Take the steroid as directed. The Hydroxyzine should help with itch but can make you sleepy. Cold compresses may also be beneficial. Try to avoid extended periods of direct sunlight when possible.    If you have been instructed to have an in-person evaluation today at a local Urgent Care facility, please use the link below. It will take you to a list of all of our available [facility], including address, phone number and hours of operation. Please do not delay care.  [facility]  If you or a family member do not have a primary care provider, use the link below to schedule a visit and establish care. When you choose a [facility] primary care physician or advanced practice provider, you gain a long-term partner in health. Find a Primary Care Provider  Learn more about [facility]'s in-office and virtual care options: [facility]

## 2022-01-26 ENCOUNTER — Encounter: Payer: Self-pay | Admitting: Obstetrics & Gynecology

## 2022-01-27 ENCOUNTER — Telehealth: Payer: Self-pay | Admitting: *Deleted

## 2022-01-27 MED ORDER — NORETHIN ACE-ETH ESTRAD-FE 1.5-30 MG-MCG PO TABS: 1.0000 | ORAL_TABLET | Freq: Every day | ORAL | 4 refills | Status: DC

## 2022-01-29 ENCOUNTER — Ambulatory Visit: Payer: Commercial Managed Care - HMO | Admitting: Physician Assistant

## 2022-01-29 ENCOUNTER — Encounter: Payer: Self-pay | Admitting: Physician Assistant

## 2022-01-29 VITALS — BP 122/80 | HR 105 | Ht 62.0 in | Wt 153.0 lb

## 2022-01-29 DIAGNOSIS — R1013 Epigastric pain: Secondary | ICD-10-CM | POA: Diagnosis not present

## 2022-01-29 DIAGNOSIS — Z8719 Personal history of other diseases of the digestive system: Secondary | ICD-10-CM | POA: Diagnosis not present

## 2022-01-29 DIAGNOSIS — K219 Gastro-esophageal reflux disease without esophagitis: Secondary | ICD-10-CM | POA: Diagnosis not present

## 2022-01-29 MED ORDER — FAMOTIDINE 40 MG PO TABS: 40.0000 mg | ORAL_TABLET | Freq: Two times a day (BID) | ORAL | 2 refills | Status: DC

## 2022-01-29 MED ORDER — NA SULFATE-K SULFATE-MG SULF 17.5-3.13-1.6 GM/177ML PO SOLN: 1.0000 | Freq: Once | ORAL | 0 refills | Status: AC

## 2022-01-29 NOTE — Progress Notes (Signed)
Chief Complaint: History of diverticulitis and epigastric pain  HPI:    [name] is a 46 year old Hispanic female, who was referred to me by Princess Bruins, MD for a complaint of diverticulitis and epigastric pain.      01/05/2022 patient seen in the ER for epigastric pain.  At that time describes several hours of burning epigastric pain moving into her back associated with nausea and vomiting.  Apparently had been seen for similar about a year prior and was diagnosed with gastritis.  At that time CBC and CMP normal.  CT scan did show extensive colonic diverticulosis with findings of early acute uncomplicated sigmoid diverticulitis.  Moderately distended gallbladder with suggestion of diffuse wall thickening and trace pericholecystic fluid.  Fibroid uterus.  3 mm nodule in the left lower lobe of the lung.  Right upper quadrant ultrasound showed moderate amount of gallbladder sludge and small gallbladder polyps.  No gallstones or acute cholecystitis.  Normal liver.  That time patient was discharged for treatment with diverticulitis with Cipro/Flagyl as well as Protonix and Carafate for her likely GERD/gastritis and referred to [place].    Today, the patient presents to clinic accompanied by interpreter, tells me that she is doing okay at the moment but continues with reflux and epigastric discomfort.  Apparently this has been going on for the past 3 years.  She was started on Pantoprazole during recent ER visit but developed hives which went away after she stopped this medication.  Initially her hives were thought related to the antibiotic she was started on but they continued even after 2 doses of prednisone until she stopped the Pantoprazole.  She does use Tums occasionally now which helps a little bit.  Denies any symptoms of diverticulitis.    Does tell me her mother has pancreatic cancer, so she is slightly worried about this.    Denies fever, chills, weight loss, blood in her stool or symptoms  that awaken her from sleep.  Past Medical History:  Diagnosis Date   diverticulitis    Endometrial polyp    AND INTRAUTERINE LESIONS   GERD (gastroesophageal reflux disease)    Menorrhagia     Past Surgical History:  Procedure Laterality Date   DILATATION & CURETTAGE/HYSTEROSCOPY WITH MYOSURE N/A 02/11/2017   Procedure: DILATATION & CURETTAGE/HYSTEROSCOPY WITH MYOSURE;  Surgeon: Princess Bruins, MD;  Location: [place] [place];  Service: Gynecology;  Laterality: N/A;  request 1:00pm OR time  request one hour   Liposunction     REDUCTION MAMMAPLASTY Bilateral 2009   and Liposuction    Current Outpatient Medications  Medication Sig Dispense Refill   hydrOXYzine (VISTARIL) 25 MG capsule Take 1 capsule (25 mg total) by mouth every 8 (eight) hours as needed. 30 capsule 0   norethindrone-ethinyl estradiol-iron (JUNEL FE 1.5/30) 1.5-30 MG-MCG tablet Take 1 tablet by mouth daily. 84 tablet 4   pantoprazole (PROTONIX) 20 MG tablet Take 1 tablet (20 mg total) by mouth daily. 30 tablet 0   predniSONE (DELTASONE) 10 MG tablet Take 4 tablets (40 mg total) by mouth daily with breakfast for 4 days, THEN 3 tablets (30 mg total) daily with breakfast for 4 days, THEN 2 tablets (20 mg total) daily with breakfast for 3 days, THEN 1 tablet (10 mg total) daily with breakfast for 3 days. 37 tablet 0   No current facility-administered medications for this visit.    Allergies as of 01/29/2022 - Review Complete 01/26/2022  Allergen Reaction Noted   Iodine Other (See Comments)  12/23/2016    Family History  Problem Relation Age of Onset   Diabetes Mother    Hypertension Mother    Cancer Mother        endometrial   Cancer Maternal Aunt        COLON   Stroke Father    Hypertension Father    Hyperthyroidism Sister    Asthma Sister     Social History   Socioeconomic History   Marital status: Single    Spouse name: Not on file   Number of children: 2   Years of education: Not on  file   Highest education level: High school graduate  Occupational History   Not on file  Tobacco Use   Smoking status: Former    Packs/day: 0.50    Years: 10.00    Total pack years: 5.00    Types: Cigarettes    Quit date: 01/01/2019    Years since quitting: 3.0   Smokeless tobacco: Never   Tobacco comments:    10 CIG. PER DAY. Restarted smoking 06/2019  Vaping Use   Vaping Use: Never used  Substance and Sexual Activity   Alcohol use: Yes    Comment: OCCASIONAL   Drug use: No   Sexual activity: Yes    Partners: Male    Birth control/protection: None  Other Topics Concern   Not on file  Social History Narrative   Not on file   Social Determinants of Health   Financial Resource Strain: Not on file  Food Insecurity: Not on file  Transportation Needs: No Transportation Needs (12/20/2019)   PRAPARE - Hydrologist (Medical): No    Lack of Transportation (Non-Medical): No  Physical Activity: Not on file  Stress: Not on file  Social Connections: Not on file  Intimate Partner Violence: Not on file    Review of Systems:    Constitutional: No weight loss, fever or chills Skin: No rash  Cardiovascular: No chest pain Respiratory: No SOB Gastrointestinal: See HPI and otherwise negative Genitourinary: No dysuria  Neurological: No headache, dizziness or syncope Musculoskeletal: No new muscle or joint pain Hematologic: No bleeding  Psychiatric: No history of depression or anxiety   Physical Exam:  Vital signs: BP 122/80   Pulse (!) 105   Ht '5\' 2"'$  (1.575 m)   Wt 153 lb (69.4 kg)   LMP 12/09/2021   BMI 27.98 kg/m    Constitutional:  Very Pleasant Hispanic female appears to be in NAD, Well developed, Well nourished, alert and cooperative Head:  Normocephalic and atraumatic. Eyes:   PEERL, EOMI. No icterus. Conjunctiva pink. Ears:  Normal auditory acuity. Neck:  Supple Throat: Oral cavity and pharynx without inflammation, swelling or lesion.   Respiratory: Respirations even and unlabored. Lungs clear to auscultation bilaterally.   No wheezes, crackles, or rhonchi.  Cardiovascular: Normal S1, S2. No MRG. Regular rate and rhythm. No peripheral edema, cyanosis or pallor.  Gastrointestinal:  Soft, nondistended, mild epigastric ttp, No rebound or guarding. Normal bowel sounds. No appreciable masses or hepatomegaly. Rectal:  Not performed.  Msk:  Symmetrical without gross deformities. Without edema, no deformity or joint abnormality.  Neurologic:  Alert and  oriented x4;  grossly normal neurologically.  Skin:   Dry and intact without significant lesions or rashes. Psychiatric: Demonstrates good judgement and reason without abnormal affect or behaviors.  RELEVANT LABS AND IMAGING: CBC    Component Value Date/Time   WBC 10.1 01/05/2022 0600   RBC 4.63 01/05/2022  0600   HGB 13.4 01/05/2022 0600   HCT 40.2 01/05/2022 0600   PLT 258 01/05/2022 0600   MCV 86.8 01/05/2022 0600   MCH 28.9 01/05/2022 0600   MCHC 33.3 01/05/2022 0600   RDW 14.2 01/05/2022 0600   LYMPHSABS 3.7 01/07/2021 0042   MONOABS 1.0 01/07/2021 0042   EOSABS 0.3 01/07/2021 0042   BASOSABS 0.1 01/07/2021 0042    CMP     Component Value Date/Time   NA 136 01/05/2022 0600   K 4.1 01/05/2022 0600   CL 103 01/05/2022 0600   CO2 23 01/05/2022 0600   GLUCOSE 108 (H) 01/05/2022 0600   BUN 16 01/05/2022 0600   CREATININE 1.19 (H) 01/05/2022 0600   CREATININE 0.90 01/31/2019 1009   CALCIUM 10.8 (H) 01/05/2022 0600   PROT 7.6 01/05/2022 0600   ALBUMIN 4.2 01/05/2022 0600   AST 12 (L) 01/05/2022 0600   ALT 10 01/05/2022 0600   ALKPHOS 42 01/05/2022 0600   BILITOT 0.3 01/05/2022 0600   GFRNONAA 57 (L) 01/05/2022 0600   GFRAA >60 02/06/2019 0800    Assessment: 1.  Epigastric pain and reflux: For the past 3 years off-and-on, had a hive reaction to Pantoprazole recently, no previous endoscopic work-up; likely reactive gastritis +/- H. pylori +/- PUD 2.  History  of diverticulitis: Recent diagnosis in the ED, though she was asymptomatic but this was seen on a CT, treated with Cipro and Flagyl  Plan: 1.  Scheduled patient for diagnostic EGD and colonoscopy in the [facility] with Dr. Lyndel Safe.  These will be scheduled at the end of July/beginning of August to allow her 4 to 6 weeks after being off of antibiotics for recent treatment of diverticulitis.  Did provide the patient a detailed list of risks for the procedures and she agrees to proceed. Patient is appropriate for endoscopic procedure(s) in the ambulatory ([facility]) setting.  2.  Started  the patient on Pepcid 40 mg twice daily, every morning and nightly #60 with 5 refills.  Hopefully this will work better for her as she seemed to have hives to Pantoprazole. 3.  Reviewed patient's recent CT with her.  There are no signs of pancreatic cancer. 4.  Patient to follow in clinic per recommendations after time of procedures.  Ellouise Newer, PA-C [facility] 01/29/2022, 10:23 AM  Cc: Princess Bruins, MD

## 2022-01-29 NOTE — Telephone Encounter (Signed)
BCP switched to mail order pharmacy.

## 2022-01-29 NOTE — Patient Instructions (Addendum)
Hemos enviado los siguientes medicamentos a su farmacia para que los recoja a su conveniencia: Suprep , Pptido  Se le ha programado una endoscopia y Ardelia Mems colonoscopia. Siga las instrucciones escritas que se le dieron en su visita de hoy. Recoja sus suministros de preparacin en la farmacia dentro de los prximos 1 a 3 das. Si [place] inhaladores (aunque solo sea necesario), trigalos el da de su procedimiento.  Si tiene 65 aos o ms, su ndice de [employer] corporal debe estar entre 23 y 37. Su ndice de masa corporal es de 27,98 kg/m. Si esto est fuera del rango mencionado anteriormente, considere hacer un seguimiento con su proveedor de Midwife.  Si tiene 13 aos o menos, su ndice de [employer] corporal debe estar entre 56 y 4. Su ndice de masa corporal es de 27,98 kg/m. Si esto est fuera del rango mencionado anteriormente, considere hacer un seguimiento con su proveedor de Midwife.  ________________________________________________________  [employer] de [facility] desean alentarlo a que use MYCHART para comunicarse con los proveedores para solicitudes o preguntas que no sean urgentes. Debido a los Astronomer de espera en el telfono, enviar un mensaje a su proveedor por [employer] puede ser una forma ms rpida y eficiente de obtener una respuesta. Espere 48 horas hbiles para obtener [employer]. Recuerde que esto es para solicitudes no urgentes.  ________________________________________________________________  We have sent the following medications to your pharmacy for you to pick up at your convenience: [place] have been scheduled for an endoscopy and colonoscopy. Please follow the written instructions given to you at your visit today. Please pick up your prep supplies at the pharmacy within the next 1-3 days. If you use inhalers (even only as needed), please bring them with you on the day of your procedure.  If you are age 67 or older, your body  mass index should be between 23-30. Your Body mass index is 27.98 kg/m. If this is out of the aforementioned range listed, please consider follow up with your Primary Care Provider.  If you are age 83 or younger, your body mass index should be between 19-25. Your Body mass index is 27.98 kg/m. If this is out of the aformentioned range listed, please consider follow up with your Primary Care Provider.   ________________________________________________________  The [facility] providers would like to encourage you to use [facility] to communicate with providers for non-urgent requests or questions.  Due to long hold times on the telephone, sending your provider a message by [facility] may be a faster and more efficient way to get a response.  Please allow 48 business hours for a response.  Please remember that this is for non-urgent requests.  _______________________________________________________  Thank you for choosing me and [facility].  Ellouise Newer PA

## 2022-01-29 NOTE — Progress Notes (Signed)
Agree with assessment/plan RG 

## 2022-03-04 ENCOUNTER — Encounter: Payer: Commercial Managed Care - HMO | Admitting: Gastroenterology

## 2022-03-22 ENCOUNTER — Ambulatory Visit (AMBULATORY_SURGERY_CENTER): Payer: Commercial Managed Care - HMO | Admitting: Gastroenterology

## 2022-03-22 ENCOUNTER — Encounter: Payer: Self-pay | Admitting: Gastroenterology

## 2022-03-22 VITALS — BP 132/77 | HR 85 | Temp 99.1°F | Resp 13 | Ht 62.0 in | Wt 153.0 lb

## 2022-03-22 DIAGNOSIS — D128 Benign neoplasm of rectum: Secondary | ICD-10-CM

## 2022-03-22 DIAGNOSIS — K621 Rectal polyp: Secondary | ICD-10-CM

## 2022-03-22 DIAGNOSIS — K2970 Gastritis, unspecified, without bleeding: Secondary | ICD-10-CM | POA: Diagnosis not present

## 2022-03-22 DIAGNOSIS — Z8719 Personal history of other diseases of the digestive system: Secondary | ICD-10-CM

## 2022-03-22 DIAGNOSIS — Z1211 Encounter for screening for malignant neoplasm of colon: Secondary | ICD-10-CM

## 2022-03-22 DIAGNOSIS — K219 Gastro-esophageal reflux disease without esophagitis: Secondary | ICD-10-CM

## 2022-03-22 DIAGNOSIS — K319 Disease of stomach and duodenum, unspecified: Secondary | ICD-10-CM

## 2022-03-22 DIAGNOSIS — R1013 Epigastric pain: Secondary | ICD-10-CM

## 2022-03-22 MED ORDER — SODIUM CHLORIDE 0.9 % IV SOLN: 500.0000 mL | Freq: Once | INTRAVENOUS | Status: DC

## 2022-03-22 MED ORDER — ESOMEPRAZOLE MAGNESIUM 40 MG PO CPDR: 40.0000 mg | DELAYED_RELEASE_CAPSULE | Freq: Every day | ORAL | 11 refills | Status: DC

## 2022-03-22 NOTE — Progress Notes (Unsigned)
To pacu, VSS. Report to [email or web address] 

## 2022-03-22 NOTE — Op Note (Signed)
[facility] Patient Name: Sherry Hunt Procedure Date: 03/22/2022 3:40 PM MRN: [id number] Endoscopist: Jackquline Denmark , MD Age: 46 Referring MD:  Date of Birth: [birth year] Gender: Female Account #: [id number] Procedure:                Colonoscopy Indications:              Screening for colorectal malignant neoplasm. H/O                            diverticulitis. Medicines:                Monitored Anesthesia Care Procedure:                Pre-Anesthesia Assessment:                           - Prior to the procedure, a History and Physical                            was performed, and patient medications and                            allergies were reviewed. The patient's tolerance of                            previous anesthesia was also reviewed. The risks                            and benefits of the procedure and the sedation                            options and risks were discussed with the patient.                            All questions were answered, and informed consent                            was obtained. Prior Anticoagulants: The patient has                            taken no previous anticoagulant or antiplatelet                            agents. ASA Grade Assessment: II - A patient with                            mild systemic disease. After reviewing the risks                            and benefits, the patient was deemed in                            satisfactory condition to undergo the procedure.  After obtaining informed consent, the colonoscope                            was passed under direct vision. Throughout the                            procedure, the patient's blood pressure, pulse, and                            oxygen saturations were monitored continuously. The                            Olympus [id number]-[id number] ([id number]) Colonoscope was                            introduced through the anus and  advanced to the 2                            cm into the ileum. The colonoscopy was performed                            without difficulty. The patient tolerated the                            procedure well. The quality of the bowel                            preparation was good. The terminal ileum, ileocecal                            valve, appendiceal orifice, and rectum were                            photographed. Scope In: 3:58:38 PM Scope Out: 4:13:03 PM Scope Withdrawal Time: 0 hours 9 minutes 34 seconds  Total Procedure Duration: 0 hours 14 minutes 25 seconds  Findings:                 A 2 mm polyp was found in the rectum. The polyp was                            sessile. The polyp was removed with a cold biopsy                            forceps. Resection and retrieval were complete.                           Multiple medium-mouthed diverticula were found in                            the sigmoid colon, descending colon, ascending                            colon and few in cecum.  Non-bleeding internal hemorrhoids were found during                            retroflexion. The hemorrhoids were small and Grade                            I (internal hemorrhoids that do not prolapse).                           The terminal ileum appeared normal.                           The exam was otherwise without abnormality on                            direct and retroflexion views. Complications:            No immediate complications. Estimated Blood Loss:     Estimated blood loss: none. Impression:               - One 2 mm polyp in the rectum, removed with a cold                            biopsy forceps. Resected and retrieved.                           - Moderate pancolonic diverticulosis                           - Non-bleeding internal hemorrhoids.                           - The examined portion of the ileum was normal.                           - The  examination was otherwise normal on direct                            and retroflexion views. Recommendation:           - Patient has a contact number available for                            emergencies. The signs and symptoms of potential                            delayed complications were discussed with the                            patient. Return to normal activities tomorrow.                            Written discharge instructions were provided to the                            patient.                           -  High fiber diet.                           - Continue present medications.                           - Await pathology results.                           - Repeat colonoscopy for surveillance based on                            pathology results.                           - The findings and recommendations were discussed                            with the patient's family. Jackquline Denmark, MD 03/22/2022 4:19:43 PM This report has been signed electronically.

## 2022-03-22 NOTE — Patient Instructions (Signed)
Await pathology results.  Handouts on Heartburn/GERD, polyps, and gastritis given.  A new RX for Nexium has been sent to your pharmacy.   YOU HAD AN ENDOSCOPIC PROCEDURE TODAY AT [facility]:   Refer to the procedure report that was given to you for any specific questions about what was found during the examination.  If the procedure report does not answer your questions, please call your gastroenterologist to clarify.  If you requested that your care partner not be given the details of your procedure findings, then the procedure report has been included in a sealed envelope for you to review at your convenience later.  YOU SHOULD EXPECT: Some feelings of bloating in the abdomen. Passage of more gas than usual.  Walking can help get rid of the air that was put into your GI tract during the procedure and reduce the bloating. If you had a lower endoscopy (such as a colonoscopy or flexible sigmoidoscopy) you may notice spotting of blood in your stool or on the toilet paper. If you underwent a bowel prep for your procedure, you may not have a normal bowel movement for a few days.  Please Note:  You might notice some irritation and congestion in your nose or some drainage.  This is from the oxygen used during your procedure.  There is no need for concern and it should clear up in a day or so.  SYMPTOMS TO REPORT IMMEDIATELY:  Following lower endoscopy (colonoscopy or flexible sigmoidoscopy):  Excessive amounts of blood in the stool  Significant tenderness or worsening of abdominal pains  Swelling of the abdomen that is new, acute  Fever of 100F or higher  Following upper endoscopy (EGD)  Vomiting of blood or coffee ground material  New chest pain or pain under the shoulder blades  Painful or persistently difficult swallowing  New shortness of breath  Fever of 100F or higher  Black, tarry-looking stools  For urgent or emergent issues, a gastroenterologist can be reached at  any hour by calling [phone]. Do not use MyChart messaging for urgent concerns.    DIET:  We do recommend a small meal at first, but then you may proceed to your regular diet.  Drink plenty of fluids but you should avoid alcoholic beverages for 24 hours.  ACTIVITY:  You should plan to take it easy for the rest of today and you should NOT DRIVE or use heavy machinery until tomorrow (because of the sedation medicines used during the test).    FOLLOW UP: Our staff will call the number listed on your records the next business day following your procedure.  We will call around 7:15- 8:00 am to check on you and address any questions or concerns that you may have regarding the information given to you following your procedure. If we do not reach you, we will leave a message.  If you develop any symptoms (ie: fever, flu-like symptoms, shortness of breath, cough etc.) before then, please call [phone].  If you test positive for Covid 19 in the 2 weeks post procedure, please call and report this information to [place].    If any biopsies were taken you will be contacted by phone or by letter within the next 1-3 weeks.  Please call us at [phone] if you have not heard about the biopsies in 3 weeks.    SIGNATURES/CONFIDENTIALITY: You and/or your care partner have signed paperwork which will be entered into your electronic medical record.  These signatures attest  to the fact that that the information above on your After Visit Summary has been reviewed and is understood.  Full responsibility of the confidentiality of this discharge information lies with you and/or your care-partner.

## 2022-03-22 NOTE — Progress Notes (Signed)
Chief Complaint: History of diverticulitis and epigastric pain   HPI:    [name] is a 46 year old Hispanic female, who was referred to me by Princess Bruins, MD for a complaint of diverticulitis and epigastric pain.      01/05/2022 patient seen in the ER for epigastric pain.  At that time describes several hours of burning epigastric pain moving into her back associated with nausea and vomiting.  Apparently had been seen for similar about a year prior and was diagnosed with gastritis.  At that time CBC and CMP normal.  CT scan did show extensive colonic diverticulosis with findings of early acute uncomplicated sigmoid diverticulitis.  Moderately distended gallbladder with suggestion of diffuse wall thickening and trace pericholecystic fluid.  Fibroid uterus.  3 mm nodule in the left lower lobe of the lung.  Right upper quadrant ultrasound showed moderate amount of gallbladder sludge and small gallbladder polyps.  No gallstones or acute cholecystitis.  Normal liver.  That time patient was discharged for treatment with diverticulitis with Cipro/Flagyl as well as Protonix and Carafate for her likely GERD/gastritis and referred to [place].    Today, the patient presents to clinic accompanied by interpreter, tells me that she is doing okay at the moment but continues with reflux and epigastric discomfort.  Apparently this has been going on for the past 3 years.  She was started on Pantoprazole during recent ER visit but developed hives which went away after she stopped this medication.  Initially her hives were thought related to the antibiotic she was started on but they continued even after 2 doses of prednisone until she stopped the Pantoprazole.  She does use Tums occasionally now which helps a little bit.  Denies any symptoms of diverticulitis.    Does tell me her mother has pancreatic cancer, so she is slightly worried about this.    Denies fever, chills, weight loss, blood in her stool or symptoms  that awaken her from sleep.       Past Medical History:  Diagnosis Date   diverticulitis     Endometrial polyp      AND INTRAUTERINE LESIONS   GERD (gastroesophageal reflux disease)     Menorrhagia             Past Surgical History:  Procedure Laterality Date   DILATATION & CURETTAGE/HYSTEROSCOPY WITH MYOSURE N/A 02/11/2017    Procedure: DILATATION & CURETTAGE/HYSTEROSCOPY WITH MYOSURE;  Surgeon: Princess Bruins, MD;  Location: [facility];  Service: Gynecology;  Laterality: N/A;  request 1:00pm OR time   request one hour   Liposunction       REDUCTION MAMMAPLASTY Bilateral 2009    and Liposuction            Current Outpatient Medications  Medication Sig Dispense Refill   hydrOXYzine (VISTARIL) 25 MG capsule Take 1 capsule (25 mg total) by mouth every 8 (eight) hours as needed. 30 capsule 0   norethindrone-ethinyl estradiol-iron (JUNEL FE 1.5/30) 1.5-30 MG-MCG tablet Take 1 tablet by mouth daily. 84 tablet 4   pantoprazole (PROTONIX) 20 MG tablet Take 1 tablet (20 mg total) by mouth daily. 30 tablet 0   predniSONE (DELTASONE) 10 MG tablet Take 4 tablets (40 mg total) by mouth daily with breakfast for 4 days, THEN 3 tablets (30 mg total) daily with breakfast for 4 days, THEN 2 tablets (20 mg total) daily with breakfast for 3 days, THEN 1 tablet (10 mg total) daily with breakfast for 3 days. [place]  tablet 0    No current facility-administered medications for this visit.           Allergies as of 01/29/2022 - Review Complete 01/26/2022  Allergen Reaction Noted   Iodine Other (See Comments) 12/23/2016           Family History  Problem Relation Age of Onset   Diabetes Mother     Hypertension Mother     Cancer Mother          endometrial   Cancer Maternal Aunt          COLON   Stroke Father     Hypertension Father     Hyperthyroidism Sister     Asthma Sister        Social History         Socioeconomic History   Marital status: Single      Spouse  name: Not on file   Number of children: 2   Years of education: Not on file   Highest education level: High school graduate  Occupational History   Not on file  Tobacco Use   Smoking status: Former      Packs/day: 0.50      Years: 10.00      Total pack years: 5.00      Types: Cigarettes      Quit date: 01/01/2019      Years since quitting: 3.0   Smokeless tobacco: Never   Tobacco comments:      10 CIG. PER DAY. Restarted smoking 06/2019  Vaping Use   Vaping Use: Never used  Substance and Sexual Activity   Alcohol use: Yes      Comment: OCCASIONAL   Drug use: No   Sexual activity: Yes      Partners: Male      Birth control/protection: None  Other Topics Concern   Not on file  Social History Narrative   Not on file    Social Determinants of Health        Financial Resource Strain: Not on file  Food Insecurity: Not on file  Transportation Needs: No Transportation Needs (12/20/2019)    PRAPARE - Armed forces logistics/support/administrative officer (Medical): No     Lack of Transportation (Non-Medical): No  Physical Activity: Not on file  Stress: Not on file  Social Connections: Not on file  Intimate Partner Violence: Not on file      Review of Systems:    Constitutional: No weight loss, fever or chills Skin: No rash  Cardiovascular: No chest pain Respiratory: No SOB Gastrointestinal: See HPI and otherwise negative Genitourinary: No dysuria  Neurological: No headache, dizziness or syncope Musculoskeletal: No new muscle or joint pain Hematologic: No bleeding  Psychiatric: No history of depression or anxiety    Physical Exam:  Vital signs: BP 122/80   Pulse (!) 105   Ht '5\' 2"'$  (1.575 m)   Wt 153 lb (69.4 kg)   LMP 12/09/2021   BMI 27.98 kg/m     Constitutional:  Very Pleasant Hispanic female appears to be in NAD, Well developed, Well nourished, alert and cooperative Head:  Normocephalic and atraumatic. Eyes:   PEERL, EOMI. No icterus. Conjunctiva pink. Ears:  Normal  auditory acuity. Neck:  Supple Throat: Oral cavity and pharynx without inflammation, swelling or lesion.  Respiratory: Respirations even and unlabored. Lungs clear to auscultation bilaterally.   No wheezes, crackles, or rhonchi.  Cardiovascular: Normal S1, S2. No MRG. Regular rate and rhythm. No peripheral  edema, cyanosis or pallor.  Gastrointestinal:  Soft, nondistended, mild epigastric ttp, No rebound or guarding. Normal bowel sounds. No appreciable masses or hepatomegaly. Rectal:  Not performed.  Msk:  Symmetrical without gross deformities. Without edema, no deformity or joint abnormality.  Neurologic:  Alert and  oriented x4;  grossly normal neurologically.  Skin:   Dry and intact without significant lesions or rashes. Psychiatric: Demonstrates good judgement and reason without abnormal affect or behaviors.   RELEVANT LABS AND IMAGING: CBC Labs (Brief)          Component Value Date/Time    WBC 10.1 01/05/2022 0600    RBC 4.63 01/05/2022 0600    HGB 13.4 01/05/2022 0600    HCT 40.2 01/05/2022 0600    PLT 258 01/05/2022 0600    MCV 86.8 01/05/2022 0600    MCH 28.9 01/05/2022 0600    MCHC 33.3 01/05/2022 0600    RDW 14.2 01/05/2022 0600    LYMPHSABS 3.7 01/07/2021 0042    MONOABS 1.0 01/07/2021 0042    EOSABS 0.3 01/07/2021 0042    BASOSABS 0.1 01/07/2021 0042        CMP     Labs (Brief)          Component Value Date/Time    NA 136 01/05/2022 0600    K 4.1 01/05/2022 0600    CL 103 01/05/2022 0600    CO2 23 01/05/2022 0600    GLUCOSE 108 (H) 01/05/2022 0600    BUN 16 01/05/2022 0600    CREATININE 1.19 (H) 01/05/2022 0600    CREATININE 0.90 01/31/2019 1009    CALCIUM 10.8 (H) 01/05/2022 0600    PROT 7.6 01/05/2022 0600    ALBUMIN 4.2 01/05/2022 0600    AST 12 (L) 01/05/2022 0600    ALT 10 01/05/2022 0600    ALKPHOS 42 01/05/2022 0600    BILITOT 0.3 01/05/2022 0600    GFRNONAA 57 (L) 01/05/2022 0600    GFRAA >60 02/06/2019 0800        Assessment: 1.   Epigastric pain and reflux: For the past 3 years off-and-on, had a hive reaction to Pantoprazole recently, no previous endoscopic work-up; likely reactive gastritis +/- H. pylori +/- PUD 2.  History of diverticulitis: Recent diagnosis in the ED, though she was asymptomatic but this was seen on a CT, treated with Cipro and Flagyl   Plan: 1.  Scheduled patient for diagnostic EGD and colonoscopy in the [facility] with Dr. Lyndel Safe.  These will be scheduled at the end of July/beginning of August to allow her 4 to 6 weeks after being off of antibiotics for recent treatment of diverticulitis.  Did provide the patient a detailed list of risks for the procedures and she agrees to proceed. Patient is appropriate for endoscopic procedure(s) in the ambulatory ([facility]) setting.  2.  Started  the patient on Pepcid 40 mg twice daily, every morning and nightly #60 with 5 refills.  Hopefully this will work better for her as she seemed to have hives to Pantoprazole. 3.  Reviewed patient's recent CT with her.  There are no signs of pancreatic cancer. 4.  Patient to follow in clinic per recommendations after time of procedures.   Ellouise Newer, PA-C    Attending physician's note   I have taken history, reviewed the chart and examined the patient. I performed a substantive portion of this encounter, including complete performance of at least one of the key components, in conjunction with the APP. I agree with the Advanced  Practitioner's note, impression and recommendations.    For EGD/colon today   Carmell Austria, MD [facility] [phone]

## 2022-03-22 NOTE — Op Note (Addendum)
[facility] Patient Name: Sherry Hunt Procedure Date: 03/22/2022 3:41 PM MRN: [id number] Endoscopist: Jackquline Denmark , MD Age: 46 Referring MD:  Date of Birth: Apr 03, 1976 Gender: Female Account #: [id number] Procedure:                Upper GI endoscopy Indications:              Epigastric abdominal pain. GERD Medicines:                Monitored Anesthesia Care Procedure:                Pre-Anesthesia Assessment:                           - Prior to the procedure, a History and Physical                            was performed, and patient medications and                            allergies were reviewed. The patient's tolerance of                            previous anesthesia was also reviewed. The risks                            and benefits of the procedure and the sedation                            options and risks were discussed with the patient.                            All questions were answered, and informed consent                            was obtained. Prior Anticoagulants: The patient has                            taken no previous anticoagulant or antiplatelet                            agents. ASA Grade Assessment: II - A patient with                            mild systemic disease. After reviewing the risks                            and benefits, the patient was deemed in                            satisfactory condition to undergo the procedure.                           After obtaining informed consent, the endoscope was  passed under direct vision. Throughout the                            procedure, the patient's blood pressure, pulse, and                            oxygen saturations were monitored continuously. The                            GIF [id number] #[id number] was introduced through the                            mouth, and advanced to the second part of duodenum.                            The upper GI  endoscopy was accomplished without                            difficulty. The patient tolerated the procedure                            well. Scope In: Scope Out: Findings:                 The examined esophagus was normal with well-defined                            Z-line at 35 cm. Multiple biopsies obtained from                            proximal, mid and distal esophagus to rule out EOE.                           A small hiatal hernia was present.                           Localized mild inflammation characterized by                            erythema was found in the gastric antrum. Biopsies                            were taken with a cold forceps for histology.                           The examined duodenum was normal. Biopsies for                            histology were taken with a cold forceps for                            evaluation of celiac disease. Complications:            No immediate complications. Estimated Blood Loss:     Estimated blood loss: none. Impression:               -  Small hiatal hernia.                           - Gastritis. Biopsied.                           - Normal examined duodenum. Biopsied. Recommendation:           - Patient has a contact number available for                            emergencies. The signs and symptoms of potential                            delayed complications were discussed with the                            patient. Return to normal activities tomorrow.                            Written discharge instructions were provided to the                            patient.                           - Resume previous diet.                           - Switch Pepcid to Nexium 40 mg p.o. once a day.                            (pt apparently had rash with Protonix or                            antibiotics)                           - Await pathology results.                           - Brochures regarding GERD                            - Follow-up in 12 weeks. If better, can reduce                            Nexium to lowest effective dose. If still with                            problems, further work-up.                           - The findings and recommendations were discussed                            with the patient's family. Jackquline Denmark,  MD 03/22/2022 3:56:18 PM This report has been signed electronically.

## 2022-03-23 ENCOUNTER — Telehealth: Payer: Self-pay | Admitting: *Deleted

## 2022-03-23 NOTE — Telephone Encounter (Signed)
  Follow up Call-     03/22/2022    2:39 PM  Call back number  Post procedure Call Back phone  # [phone]  Permission to leave phone message Yes     Patient questions:  Do you have a fever, pain , or abdominal swelling? No. Pain Score  0 *  Have you tolerated food without any problems? Yes.    Have you been able to return to your normal activities? Yes.    Do you have any questions about your discharge instructions: Diet   No. Medications  No. Follow up visit  No.  Do you have questions or concerns about your Care? No.  Actions: * If pain score is 4 or above: No action needed, pain <4.

## 2022-03-26 ENCOUNTER — Telehealth: Payer: Self-pay

## 2022-03-26 NOTE — Telephone Encounter (Signed)
Patient called back states she is having abdominal spasms seeking advise.

## 2022-03-26 NOTE — Telephone Encounter (Signed)
Pt was contacted in regard to recommendations from recent EGD to follow up in 12 weeks: Through a spanish interpreter pt was left a message to call back:

## 2022-03-29 NOTE — Telephone Encounter (Signed)
Pt made aware of Dr. Lyndel Safe recommendations for a 12 week follow up from recent EGD: Pt was scheduled for 06/22/2022 at 11:20 with Dr. Lyndel Safe: Pt made aware  Pt stated that she was having some epigastric pain and pressure in her stomach although pt states that this has been getting better the last two days: Pt recently started  on esomeprazole last week: Pt notified to call our office back if her symptoms do not get any better:  Pt verbalized understanding with all questions answered.

## 2022-04-03 ENCOUNTER — Encounter: Payer: Self-pay | Admitting: Gastroenterology

## 2022-04-27 ENCOUNTER — Telehealth: Payer: Self-pay

## 2022-04-27 DIAGNOSIS — K2970 Gastritis, unspecified, without bleeding: Secondary | ICD-10-CM

## 2022-04-27 DIAGNOSIS — R1013 Epigastric pain: Secondary | ICD-10-CM

## 2022-04-27 DIAGNOSIS — K219 Gastro-esophageal reflux disease without esophagitis: Secondary | ICD-10-CM

## 2022-04-27 NOTE — Telephone Encounter (Signed)
Called the [facility] to send Nexium. Previously we went at [facility].

## 2022-05-06 MED ORDER — ESOMEPRAZOLE MAGNESIUM 40 MG PO CPDR: 40.0000 mg | DELAYED_RELEASE_CAPSULE | Freq: Every day | ORAL | 4 refills | Status: DC

## 2022-05-06 NOTE — Addendum Note (Signed)
Addended by: Curlene Labrum E on: 05/06/2022 04:36 PM   Modules accepted: Orders

## 2022-05-06 NOTE — Telephone Encounter (Signed)
Medication sent to mail order per request

## 2022-05-11 ENCOUNTER — Ambulatory Visit: Payer: Commercial Managed Care - HMO | Admitting: Family Medicine

## 2022-06-03 ENCOUNTER — Other Ambulatory Visit: Payer: Self-pay | Admitting: Physician Assistant

## 2022-06-22 ENCOUNTER — Ambulatory Visit: Payer: Commercial Managed Care - HMO | Admitting: Gastroenterology

## 2022-09-17 IMAGING — CT CT ABD-PELV W/ CM
2 of 4 series · 15 of 46 positions shown, 17 images · IV contrast (APPLIED)
Comparison: None Available.

CLINICAL DATA: Abdominal pain, acute, nonlocalized

EXAM:
CT ABDOMEN AND PELVIS WITH CONTRAST
TECHNIQUE: Multidetector CT imaging of the abdomen and pelvis was performed
using the standard protocol following bolus administration of
intravenous contrast.

[Series 2: abd pel w · axial · 0.73mm/px · z∈[-532,-122]mm · 12 of 90 slices shown, 14 images]
[im 4/90  soft-tissue]
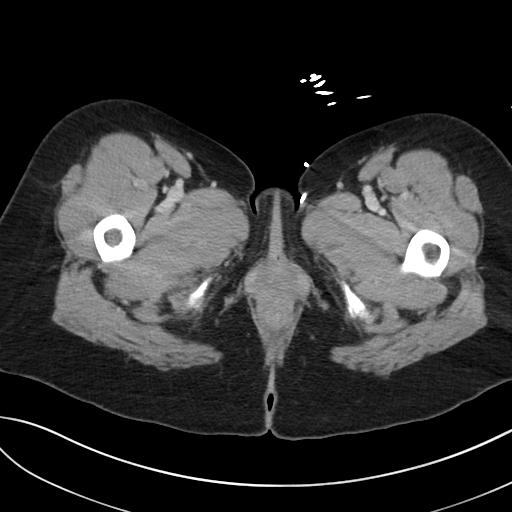
[im 4/90  bone]
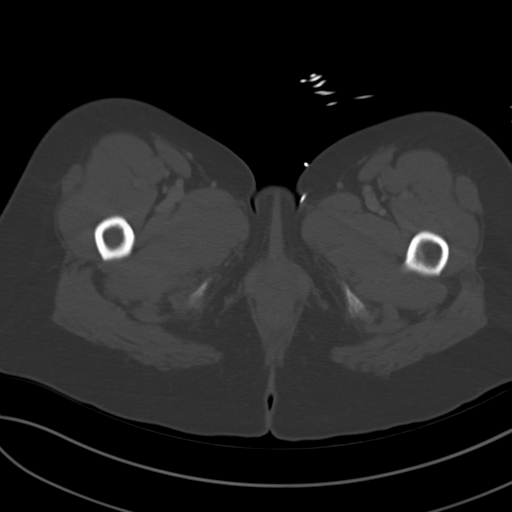
[im 12/90  soft-tissue]
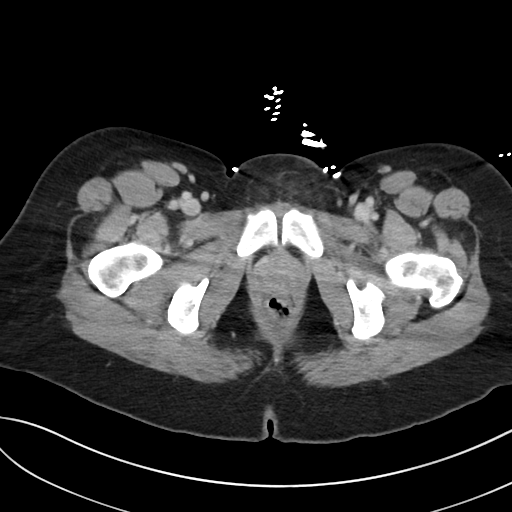
[im 20/90  soft-tissue]
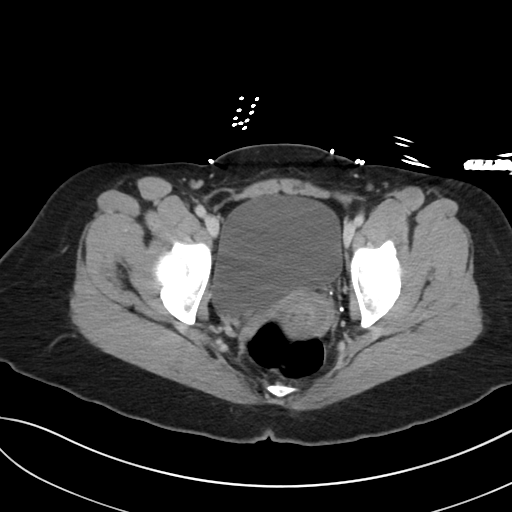
[im 28/90  soft-tissue]
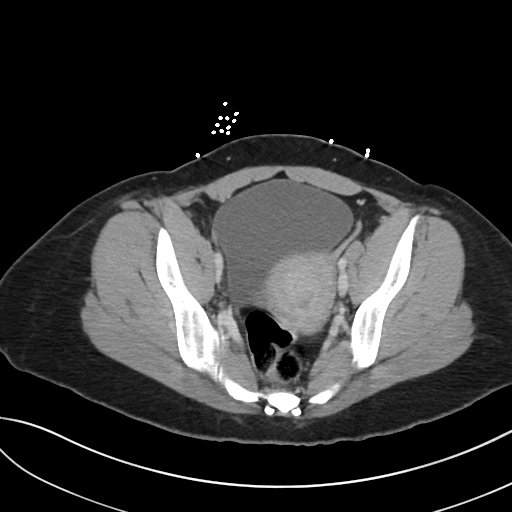
[im 35/90  soft-tissue]
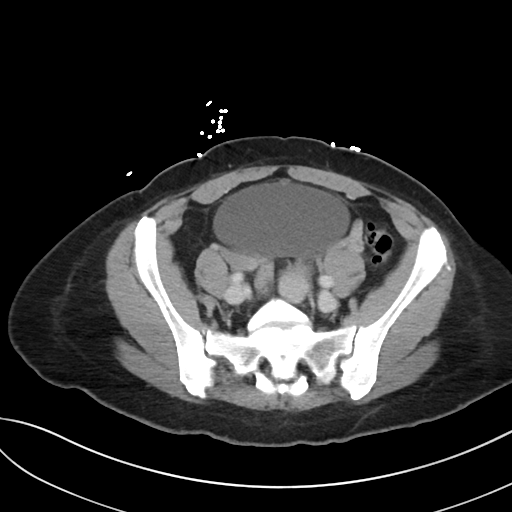
[im 43/90  soft-tissue]
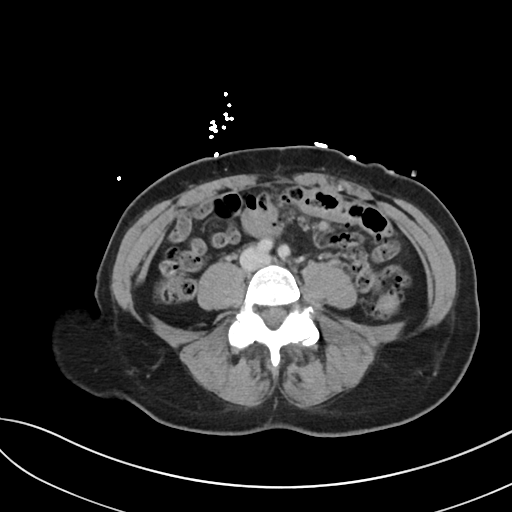
[im 47/90  soft-tissue]
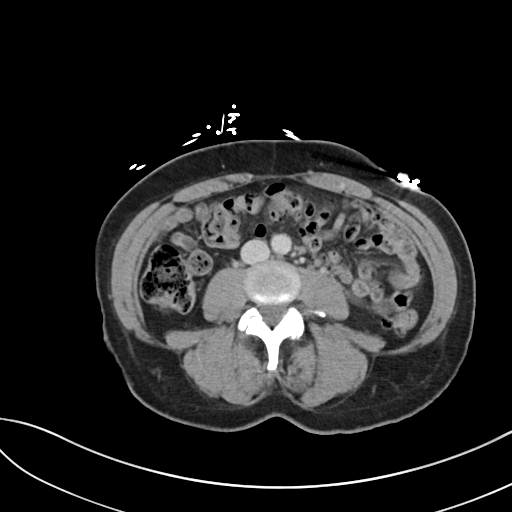
[im 55/90  soft-tissue]
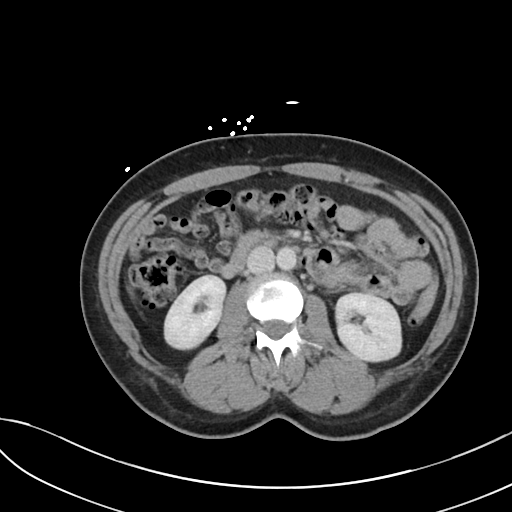
[im 62/90  soft-tissue]
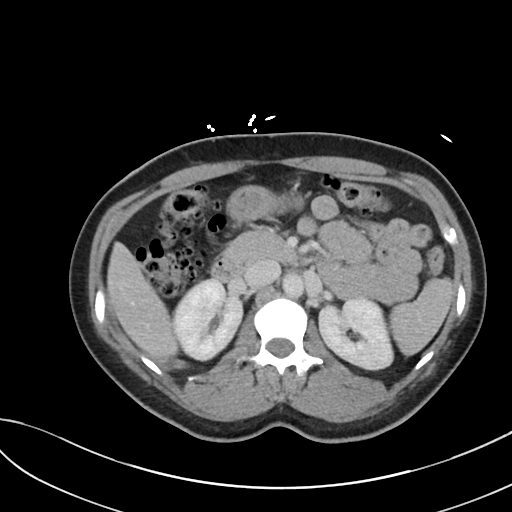
[im 62/90  bone]
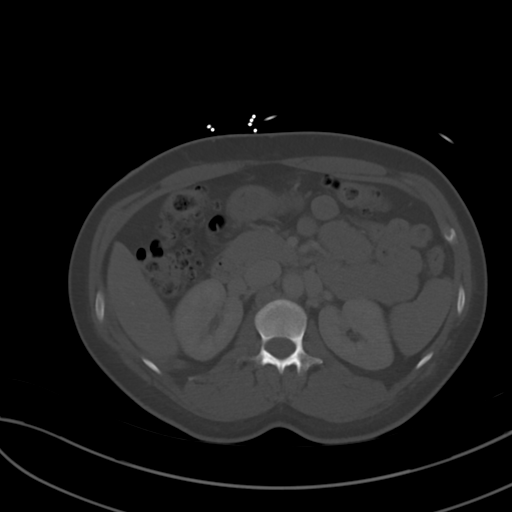
[im 70/90  soft-tissue]
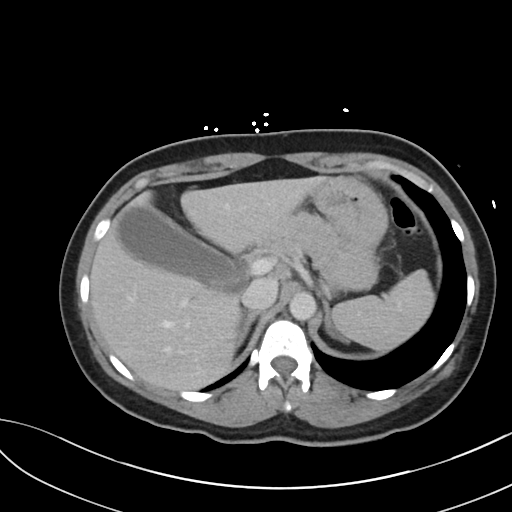
[im 78/90  soft-tissue]
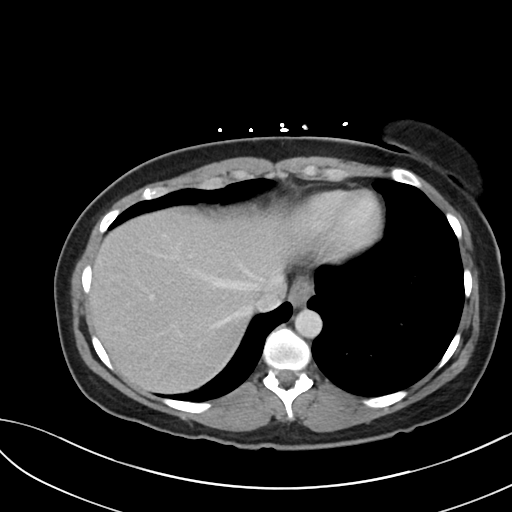
[im 86/90  soft-tissue]
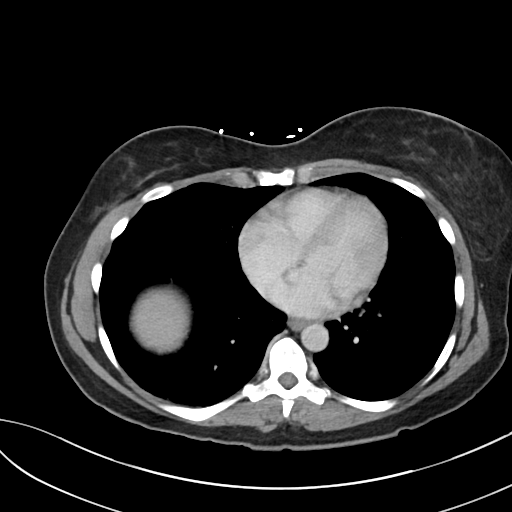

[Series 5: coronal · coronal · 0.62mm/px · 3 of 81 slices shown]
[im 27/81  soft-tissue]
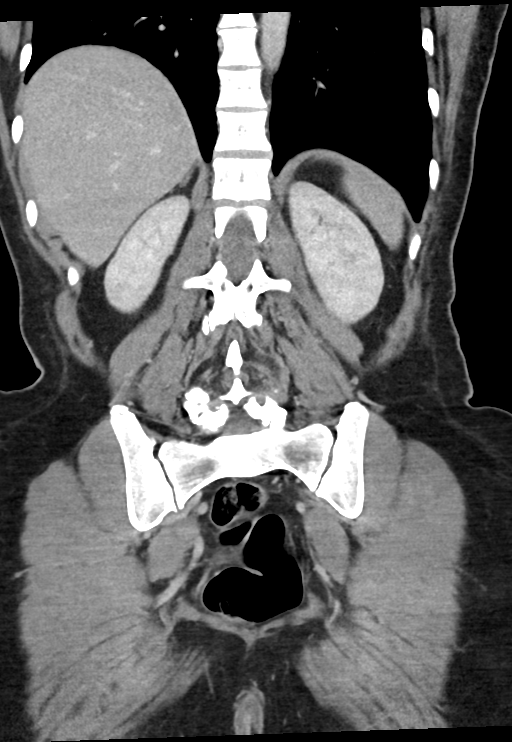
[im 36/81  soft-tissue]
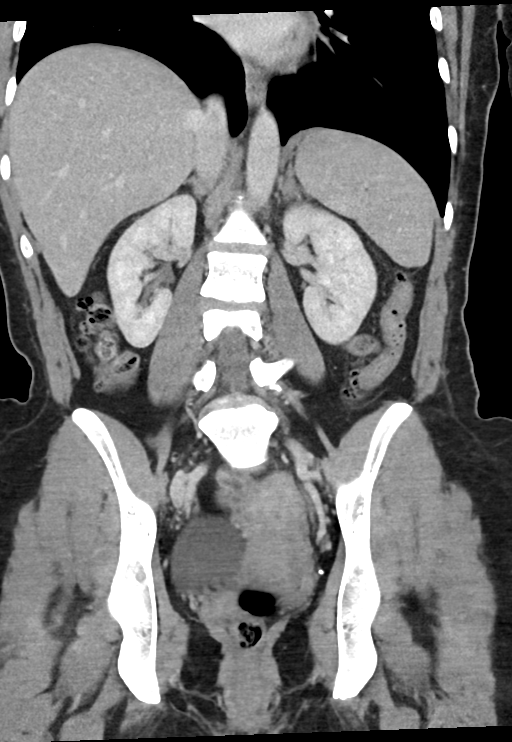
[im 45/81  soft-tissue]
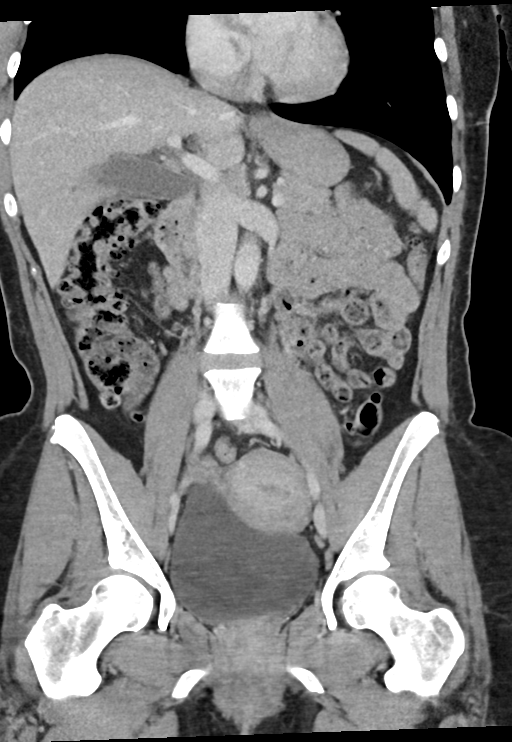

[15 of 46 positions shown; findings below may reference images not displayed]

RADIATION DOSE REDUCTION: This exam was performed according to the
departmental dose-optimization program which includes automated
exposure control, adjustment of the mA and/or kV according to
patient size and/or use of iterative reconstruction technique.

CONTRAST:  80mL OMNIPAQUE IOHEXOL 300 MG/ML  SOLN
FINDINGS: Lower chest: 3 mm noncalcified nodule within the periphery of the
left lower lobe (series 4, image 6). Minimal dependent right basilar
atelectasis. Heart size is normal.

Hepatobiliary: Moderately distended gallbladder with suggestion of
diffuse wall thickening and trace pericholecystic fluid. No
hyperdense gallstone. No pericholecystic fat stranding. Liver is
normal in appearance. No intrahepatic biliary dilatation.

Pancreas: Unremarkable. No pancreatic ductal dilatation or
surrounding inflammatory changes.

Spleen: Normal in size without focal abnormality.

Adrenals/Urinary Tract: Unremarkable adrenal glands. Kidneys enhance
symmetrically without focal lesion, stone, or hydronephrosis.
Ureters are nondilated. Urinary bladder appears unremarkable.

Stomach/Bowel: Small hiatal hernia. Stomach appears otherwise
unremarkable. No dilated loops of bowel. Normal appendix in the
right lower quadrant (series 2, image 51). Extensive colonic
diverticulosis. Mild colonic wall thickening with a mildly inflamed
diverticula involving the mid sigmoid colon (series 2, image 48). No
pericolonic free fluid or fluid collection. No extraluminal air.

Vascular/Lymphatic: No significant vascular findings are present. No
enlarged abdominal or pelvic lymph nodes.

Reproductive: Fibroid uterus.  No adnexal masses.

Other: No free fluid. No abdominopelvic fluid collection. No
pneumoperitoneum. No abdominal wall hernia.

Musculoskeletal: Grade 1 anterolisthesis of L4 on L5 secondary to
degenerative facet arthropathy. No acute bony findings.
IMPRESSION: 1. Extensive colonic diverticulosis with findings of early acute
uncomplicated sigmoid diverticulitis.
2. Moderately distended gallbladder with suggestion of diffuse wall
thickening and trace pericholecystic fluid. No hyperdense gallstone.
If there is clinical concern for acute cholecystitis, further
evaluation with right upper quadrant ultrasound is recommended.
3. Fibroid uterus.
4. Incidentally noted 3 mm noncalcified nodule within the left lower
lobe. No follow-up needed if patient is low-risk.This recommendation
follows the consensus statement: Guidelines for Management of
Incidental Pulmonary Nodules Detected on CT Images: From the

## 2023-01-18 ENCOUNTER — Ambulatory Visit: Payer: Commercial Managed Care - HMO | Admitting: Obstetrics & Gynecology

## 2023-01-20 ENCOUNTER — Ambulatory Visit: Payer: 59 | Admitting: Obstetrics & Gynecology

## 2023-01-20 ENCOUNTER — Other Ambulatory Visit (HOSPITAL_COMMUNITY)
Admission: RE | Admit: 2023-01-20 | Discharge: 2023-01-20 | Disposition: A | Discharge status: Home / Self Care | Payer: 59 | Source: Ambulatory Visit | Attending: Obstetrics & Gynecology | Admitting: Obstetrics & Gynecology

## 2023-01-20 ENCOUNTER — Encounter: Payer: Self-pay | Admitting: Obstetrics & Gynecology

## 2023-01-20 VITALS — BP 110/74 | HR 95 | Ht 62.75 in | Wt 151.0 lb

## 2023-01-20 DIAGNOSIS — Z01419 Encounter for gynecological examination (general) (routine) without abnormal findings: Secondary | ICD-10-CM

## 2023-01-20 DIAGNOSIS — N760 Acute vaginitis: Secondary | ICD-10-CM | POA: Diagnosis not present

## 2023-01-20 DIAGNOSIS — B3731 Acute candidiasis of vulva and vagina: Secondary | ICD-10-CM | POA: Diagnosis not present

## 2023-01-20 DIAGNOSIS — N898 Other specified noninflammatory disorders of vagina: Secondary | ICD-10-CM

## 2023-01-20 DIAGNOSIS — N912 Amenorrhea, unspecified: Secondary | ICD-10-CM

## 2023-01-20 DIAGNOSIS — M549 Dorsalgia, unspecified: Secondary | ICD-10-CM | POA: Diagnosis not present

## 2023-01-20 LAB — WET PREP FOR TRICH, YEAST, CLUE

## 2023-01-20 LAB — CULTURE INDICATED

## 2023-01-20 MED ORDER — TINIDAZOLE 500 MG PO TABS: 1000.0000 mg | ORAL_TABLET | Freq: Two times a day (BID) | ORAL | 0 refills | Status: AC

## 2023-01-20 MED ORDER — FLUCONAZOLE 150 MG PO TABS: 150.0000 mg | ORAL_TABLET | ORAL | 0 refills | Status: AC

## 2023-01-20 NOTE — Progress Notes (Signed)
Sherry Hunt 1976-05-25 [id number]   History:    47 y.o.  G2P2L2 Married   RP:  Established patient presenting for annual gyn exam    HPI: Stopped Junel Fe 1.5/30 8 months ago.  LMP 8 months ago.  Mild hot flushes. No pelvic pain.  No pain with intercourse.  Pap Neg 05/2020.  No h/o abnormal Pap. Pap reflex today.  Vulvar and vaginal irritation and itching.  Lower back pain.  Breasts normal. H/O bilateral breast reduction.  Mammo 12/2019.  Rt Dx mammo/US 12/2019 Neg.  Needs to schedule Mammo now. Urine and bowel movements normal. Body mass index 26.96. Needs to increase physical activities. Good nutrition.   Past medical history,surgical history, family history and social history were all reviewed and documented in the EPIC chart.  Gynecologic History No LMP recorded. (Menstrual status: Irregular Periods).  Obstetric History OB History  Gravida Para Term Preterm AB Living  2 2       2   SAB IAB Ectopic Multiple Live Births               # Outcome Date GA Lbr Len/2nd Weight Sex Delivery Anes PTL Lv  2 Para           1 Para              ROS: A ROS was performed and pertinent positives and negatives are included in the history. GENERAL: No fevers or chills. HEENT: No change in vision, no earache, sore throat or sinus congestion. NECK: No pain or stiffness. CARDIOVASCULAR: No chest pain or pressure. No palpitations. PULMONARY: No shortness of breath, cough or wheeze. GASTROINTESTINAL: No abdominal pain, nausea, vomiting or diarrhea, melena or bright red blood per rectum. GENITOURINARY: No urinary frequency, urgency, hesitancy or dysuria. MUSCULOSKELETAL: No joint or muscle pain, no back pain, no recent trauma. DERMATOLOGIC: No rash, no itching, no lesions. ENDOCRINE: No polyuria, polydipsia, no heat or cold intolerance. No recent change in weight. HEMATOLOGICAL: No anemia or easy bruising or bleeding. NEUROLOGIC: No headache, seizures, numbness, tingling or weakness.  PSYCHIATRIC: No depression, no loss of interest in normal activity or change in sleep pattern.     Exam:   BP 110/74   Pulse 95   Ht 5' 2.75" (1.594 m)   Wt 151 lb (68.5 kg)   SpO2 97%   BMI 26.96 kg/m   Body mass index is 26.96 kg/m.  General appearance : Well developed well nourished female. No acute distress HEENT: Eyes: no retinal hemorrhage or exudates,  Neck supple, trachea midline, no carotid bruits, no thyroidmegaly Lungs: Clear to auscultation, no rhonchi or wheezes, or rib retractions  Heart: Regular rate and rhythm, no murmurs or gallops Breast:Examined in sitting and supine position were symmetrical in appearance, no palpable masses or tenderness,  no skin retraction, no nipple inversion, no nipple discharge, no skin discoloration, no axillary or supraclavicular lymphadenopathy Abdomen: no palpable masses or tenderness, no rebound or guarding Extremities: no edema or skin discoloration or tenderness  Pelvic: Vulva: Normal             Vagina: No gross lesions.  White discharge.  Wet prep done.  Cervix: No gross lesions.  Pap reflex done.  Uterus  AV, normal size, shape and consistency, non-tender and mobile  Adnexa  Without masses or tenderness  Anus: Normal  Wet prep:  Yeasts present.  Clue cells present with odor.  U/A: Yellow, Slightly cloudy, Nit Neg, Pro Neg, WBC Neg, RBC 10-20, Bacteria  Few.  U. Culture pending.  UPT Neg   Assessment/Plan:  47 y.o. female for annual exam   1. Encounter for routine gynecological examination with Papanicolaou smear of cervix Stopped Junel Fe 1.5/30 8 months ago.  LMP 8 months ago.  Mild hot flushes. No pelvic pain.  No pain with intercourse.  Pap Neg 05/2020.  No h/o abnormal Pap. Pap reflex today.  Vulvar and vaginal irritation and itching.  Lower back pain.  Breasts normal. H/O bilateral breast reduction.  Mammo 12/2019.  Rt Dx mammo/US 12/2019 Neg.  Needs to schedule Mammo now. Urine and bowel movements normal. Body mass  index 26.96. Needs to increase physical activities. Good nutrition. - Cytology - PAP( [facility])  2. Amenorrhea Stopped Junel Fe 1.5/30 8 months ago.  LMP 8 months ago.  Mild hot flushes. No pelvic pain.  No pain with intercourse.  UPT Neg.  Probably entering menopause.  Will check FSH today.  Recommend using condoms. - Pregnancy, urine - FSH  3. Vaginal itching Yeast vaginitis and Bacterial vaginosis confirmed by wet prep.  Will treat with Tinidazole and Fluconazole.  Usage reviewed, prescription sent to pharmacy. - WET PREP FOR TRICH, YEAST, CLUE  4. Back pain, unspecified back location, unspecified back pain laterality, unspecified chronicity U/A with blood and few bacteria.  Will wait on Urine culture.  If U. Culture Negative, will refer to Urology to investigate on microscopic hematuria. - Urinalysis,Complete w/RFL Culture  Other orders - Acetaminophen (TYLENOL PO); Take by mouth. - fluconazole (DIFLUCAN) 150 MG tablet; Take 1 tablet (150 mg total) by mouth every other day for 3 doses. - tinidazole (TINDAMAX) 500 MG tablet; Take 2 tablets (1,000 mg total) by mouth 2 (two) times daily for 2 days.   Genia Del MD, 11:45 AM

## 2023-01-21 LAB — FOLLICLE STIMULATING HORMONE: FSH: 100.2 m[IU]/mL

## 2023-01-21 LAB — URINALYSIS, COMPLETE W/RFL CULTURE
Bilirubin Urine: NEGATIVE
Glucose, UA: NEGATIVE
Hyaline Cast: NONE SEEN /LPF
Ketones, ur: NEGATIVE
Leukocyte Esterase: NEGATIVE
Nitrites, Initial: NEGATIVE
Protein, ur: NEGATIVE
Specific Gravity, Urine: 1.015 (ref 1.001–1.035)
WBC, UA: NONE SEEN /HPF (ref 0–5)
pH: 6.5 (ref 5.0–8.0)

## 2023-01-21 LAB — URINE CULTURE
MICRO NUMBER:: 15106950
Result:: NO GROWTH
SPECIMEN QUALITY:: ADEQUATE

## 2023-01-21 LAB — PREGNANCY, URINE: Preg Test, Ur: NEGATIVE

## 2023-01-24 ENCOUNTER — Telehealth: Payer: Self-pay

## 2023-01-24 LAB — CYTOLOGY - PAP: Diagnosis: NEGATIVE

## 2023-01-24 NOTE — Telephone Encounter (Signed)
-----   Message from Genia Del, MD sent at 01/20/2023  1:36 PM EDT ----- Regarding: Hematuria U/A with blood and few bacteria.  Will wait on Urine culture.  If U. Culture Negative, refer to Urology to investigate on microscopic hematuria.

## 2023-01-24 NOTE — Telephone Encounter (Signed)
Urine culture returned negative. Would you mind notifying the pt at your earliest convenience and sending referral to urology per Dr. Sharol Roussel recommendations please and thank you.?

## 2023-01-25 NOTE — Telephone Encounter (Signed)
Patient informed of results and referral being sent to [facility].

## 2023-01-27 ENCOUNTER — Telehealth: Payer: Self-pay | Admitting: *Deleted

## 2023-01-27 ENCOUNTER — Other Ambulatory Visit: Payer: Self-pay | Admitting: *Deleted

## 2023-01-27 DIAGNOSIS — R3129 Other microscopic hematuria: Secondary | ICD-10-CM

## 2023-01-27 NOTE — Telephone Encounter (Signed)
Spoke with patient to notify of results and recommendations as seen below.   Patient requesting medication for menopausal symptoms discussed at last OV.  Pharmacy confirmed.   Dr. Seymour Bars -please advise on RX. Or if additional OV needed for discussion.

## 2023-01-27 NOTE — Telephone Encounter (Addendum)
-   Sherry Min, RN 01/27/2023 12:14 PM EDT Back to Top    Genia Del, MD  [facility] Triage; Sherry Min, RN U/A with blood and few bacteria.  Will wait on Urine culture.  If U. Culture Negative, refer to Urology to investigate on microscopic hematuria.   Spoke with patient using [employer] ID# [id number]. Advised of all results per Dr. Seymour Bars. Patient agreeable to referral. Referral placed. Patient verbalizes understanding and is agreeable.   Routing to [employer].   Selinda Eon, CMA 01/25/2023 12:18 PM EDT     Routed to spanish pool. Please let us know if any questions. Thanks.   Genia Del, MD 01/24/2023  4:54 PM EDT     Pap Negative. U. Culture No Growth   Genia Del, MD 01/21/2023 11:53 AM EDT     FSH high at 100.2.  Menopausal range.  ---- Message from Genia Del, MD sent at 01/21/2023 11:53 AM EDT ----- FSH high at 100.2.  Menopausal range.

## 2023-01-27 NOTE — Telephone Encounter (Signed)
Genia Del, MD  You30 minutes ago (4:25 PM)    Needs a Neg screening Mammo now before starting on HRT. Dr. Geradine Girt with patient, advised per Dr. Seymour Bars. Patient will return call to advise once MMG completed.   Encounter closed.

## 2023-02-14 ENCOUNTER — Encounter: Payer: Self-pay | Admitting: Obstetrics & Gynecology

## 2023-03-18 ENCOUNTER — Encounter (HOSPITAL_BASED_OUTPATIENT_CLINIC_OR_DEPARTMENT_OTHER): Payer: Self-pay | Admitting: Emergency Medicine

## 2023-03-18 ENCOUNTER — Emergency Department (HOSPITAL_BASED_OUTPATIENT_CLINIC_OR_DEPARTMENT_OTHER)
Admission: EM | Admit: 2023-03-18 | Discharge: 2023-03-18 | Discharge status: Against Medical Advice | Payer: 59 | Attending: Emergency Medicine | Admitting: Emergency Medicine

## 2023-03-18 DIAGNOSIS — R1032 Left lower quadrant pain: Secondary | ICD-10-CM | POA: Insufficient documentation

## 2023-03-18 DIAGNOSIS — R140 Abdominal distension (gaseous): Secondary | ICD-10-CM | POA: Insufficient documentation

## 2023-03-18 DIAGNOSIS — R110 Nausea: Secondary | ICD-10-CM | POA: Insufficient documentation

## 2023-03-18 DIAGNOSIS — Z5321 Procedure and treatment not carried out due to patient leaving prior to being seen by health care provider: Secondary | ICD-10-CM | POA: Diagnosis not present

## 2023-03-18 LAB — COMPREHENSIVE METABOLIC PANEL
ALT: 11 U/L (ref 0–44)
AST: 13 U/L — ABNORMAL LOW (ref 15–41)
Albumin: 4.4 g/dL (ref 3.5–5.0)
Alkaline Phosphatase: 56 U/L (ref 38–126)
Anion gap: 6 (ref 5–15)
BUN: 12 mg/dL (ref 6–20)
CO2: 25 mmol/L (ref 22–32)
Calcium: 10.3 mg/dL (ref 8.9–10.3)
Chloride: 104 mmol/L (ref 98–111)
Creatinine, Ser: 0.87 mg/dL (ref 0.44–1.00)
GFR, Estimated: >60 mL/min (ref >60)
Glucose, Bld: 134 mg/dL — ABNORMAL HIGH (ref 70–99)
Potassium: 3.8 mmol/L (ref 3.5–5.1)
Sodium: 135 mmol/L (ref 135–145)
Total Bilirubin: 0.5 mg/dL (ref 0.3–1.2)
Total Protein: 7.6 g/dL (ref 6.5–8.1)

## 2023-03-18 LAB — CBC
HCT: 40.2 % (ref 36.0–46.0)
Hemoglobin: 13.5 g/dL (ref 12.0–15.0)
MCH: 28.8 pg (ref 26.0–34.0)
MCHC: 33.6 g/dL (ref 30.0–36.0)
MCV: 85.9 fL (ref 80.0–100.0)
Platelets: 249 10*3/uL (ref 150–400)
RBC: 4.68 MIL/uL (ref 3.87–5.11)
RDW: 14.5 % (ref 11.5–15.5)
WBC: 10 10*3/uL (ref 4.0–10.5)
nRBC: 0 % (ref 0.0–0.2)

## 2023-03-18 LAB — LIPASE, BLOOD: Lipase: 16 U/L (ref 11–51)

## 2023-03-18 NOTE — ED Triage Notes (Addendum)
Pt presents to ED Pov. Pt c/o LLQ abd pain and nausea since last night. Pt reports having less bm and bloating. Pt reports pain relieved by tylenol

## 2023-03-21 ENCOUNTER — Ambulatory Visit: Payer: 59 | Admitting: Family

## 2023-03-21 VITALS — BP 146/90 | HR 87 | Temp 97.7°F | Ht 63.0 in | Wt 150.2 lb

## 2023-03-21 DIAGNOSIS — K2900 Acute gastritis without bleeding: Secondary | ICD-10-CM | POA: Diagnosis not present

## 2023-03-21 DIAGNOSIS — F17219 Nicotine dependence, cigarettes, with unspecified nicotine-induced disorders: Secondary | ICD-10-CM | POA: Diagnosis not present

## 2023-03-21 MED ORDER — VARENICLINE TARTRATE 0.5 MG PO TABS
0.5000 mg | ORAL_TABLET | ORAL | 1 refills | Status: DC
Start: 2023-03-21 — End: 2023-04-28

## 2023-03-21 MED ORDER — FAMOTIDINE 40 MG PO TABS
40.0000 mg | ORAL_TABLET | Freq: Two times a day (BID) | ORAL | 2 refills | Status: DC
Start: 2023-03-21 — End: 2023-06-21

## 2023-03-21 NOTE — Progress Notes (Signed)
New Patient Office Visit  Subjective:  Patient ID: Sherry Hunt, female    DOB: 1976/03/09  Age: 47 y.o. MRN: [id number]  CC:  Chief Complaint  Patient presents with   New Patient (Initial Visit)   GI Problem    Pt c/o gastritis and abdominal pain.  Pt has been on a liquid diet for 3 days. Pt states she was seen at [facility] on 8/16 but left.     HPI Sherry Hunt presents for establishing care today.  Stomach pain:  seen in ER on 8/16 - labs done, CMP, Lipase, CBC all wnl,  but pt had to leave, had been waiting a long time. Reports having same sx in past and told she had gastritis. She is only drinking liquids for last 3d. Also reports having nausea and vomiting, but this is better today. Has been eating soup and liquids. Pt reports not eating meats. Pt is a smoker, and drinks coffee in am.   Assessment & Plan:  Acute gastritis without hemorrhage, unspecified gastritis type sending generic Pepcid, advised on use & SE. Advised on low acid diet, handout provided, need for smoking cessation.  -     Famotidine; Take 1 tablet (40 mg total) by mouth 2 (two) times daily.  Dispense: 60 tablet; Refill: 2  Cigarette nicotine dependence with nicotine-induced disorder sending verenicline, advised on use & SE, handout provided discussing tips for success.  -     Varenicline Tartrate; Take 1 tablet (0.5 mg total) by mouth as directed. START with 1 pill daily AFTER eating for 1 week, then increase to 2 pill daily x 1 week, then 2 pill qam, 1 pill qpm x 1week, then 2 pills qam & qpm if needed.  Dispense: 90 tablet; Refill: 1   Subjective:    Outpatient Medications Prior to Visit  Medication Sig Dispense Refill   Acetaminophen (TYLENOL PO) Take by mouth.     esomeprazole (NEXIUM) 40 MG capsule Take 1 capsule (40 mg total) by mouth daily at 12 noon. 90 capsule 4   famotidine (PEPCID) 40 MG tablet TAKE 1 TABLET BY MOUTH TWICE A DAY 60 tablet 2   No  facility-administered medications prior to visit.   Past Medical History:  Diagnosis Date   Asthma    Depression    diverticulitis    Endometrial polyp    AND INTRAUTERINE LESIONS   GERD (gastroesophageal reflux disease)    Menorrhagia    Past Surgical History:  Procedure Laterality Date   DILATATION & CURETTAGE/HYSTEROSCOPY WITH MYOSURE N/A 02/11/2017   Procedure: DILATATION & CURETTAGE/HYSTEROSCOPY WITH MYOSURE;  Surgeon: Genia Del, MD;  Location: [facility];  Service: Gynecology;  Laterality: N/A;  request 1:00pm OR time  request one hour   Liposunction     REDUCTION MAMMAPLASTY Bilateral 2009   and Liposuction    Objective:   Today's Vitals: BP (!) 146/90   Pulse 87   Temp 97.7 F (36.5 C) (Temporal)   Ht 5\' 3"  (1.6 m)   Wt 150 lb 3.2 oz (68.1 kg)   SpO2 99%   BMI 26.61 kg/m   Physical Exam Vitals and nursing note reviewed.  Constitutional:      Appearance: Normal appearance.  Cardiovascular:     Rate and Rhythm: Normal rate and regular rhythm.  Pulmonary:     Effort: Pulmonary effort is normal.     Breath sounds: Normal breath sounds.  Musculoskeletal:        General: Normal  range of motion.  Skin:    General: Skin is warm and dry.  Neurological:     Mental Status: She is alert.  Psychiatric:        Mood and Affect: Mood normal.        Behavior: Behavior normal.     Meds ordered this encounter  Medications   varenicline (CHANTIX) 0.5 MG tablet    Sig: Take 1 tablet (0.5 mg total) by mouth as directed. START with 1 pill daily AFTER eating for 1 week, then increase to 2 pill daily x 1 week, then 2 pill qam, 1 pill qpm x 1week, then 2 pills qam & qpm if needed.    Dispense:  90 tablet    Refill:  1    Order Specific Question:   Supervising Provider    Answer:   ANDY, CAMILLE L [[id number]]   famotidine (PEPCID) 40 MG tablet    Sig: Take 1 tablet (40 mg total) by mouth 2 (two) times daily.    Dispense:  60 tablet    Refill:  2     Order Specific Question:   Supervising Provider    Answer:   ANDY, CAMILLE L [[id number]]    Dulce Sellar, NP

## 2023-03-21 NOTE — Patient Instructions (Addendum)
Bienvenido a Insurance claims handler en [place]. Fue un placer conocerlo hoy!    Como comentamos, he enviado a su farmacia el Chantix genrico para ayudarle a dejar de fumar. Tambin le envi una recarga de Pepcid (Famotidina) genrico que usted tuvo en el pasado. Esto ayudar a Chief Executive Officer que puede estar causando Chief Technology Officer. Es necesario dejar de fumar ya que esto puede empeorar el cido, adems de tratar de reducir los alimentos cidos como jugos, frutas jugosas, cafena y Librarian, academic.  Para la picazn en el odo, busque una crema de hidrocortisona de venta libre y aplique una capa delgada en el exterior de la [place].     TENGA EN CUENTA: Si se realiz alguna prueba de laboratorio, infrmenos si no recibe Musician de [address] Es posible que vea sus resultados en MyChart antes de que tengamos la oportunidad de Stage manager, pero le llamaremos una vez que los [place]. Si solicitamos REFERENCIAS hoy, infrmenos si no recibi noticias de su oficina dentro de la prxima semana.  Hganos saber a travs de MyChart si necesita RECARGAS o pdale a su farmacia que nos enve la solicitud. Tambin puede [employer] MyChart para comunicarse conmigo o con cualquier miembro del personal de la oficina.  Pruebe estos consejos para mantener un estilo de vida saludable:  Es importante que haga ejercicio regularmente al menos 30 minutos 5 veces por semana.  Piensa en lo que comers, planifica con antelacin.  Elija alimentos integrales y piense en alimentos "limpios, verdes, frescos o congelados" en lugar de alimentos enlatados, procesados o envasados que son ms azucarados, salados y Lexicographer.  Del 70 al 75% de los alimentos consumidos deben consistir en verduras frescas y protenas.  2-3 comidas diarias con refrigerios saludables entre comidas, pero deben consistir en frutas, protenas o verduras enteras.  Trate de comer durante un perodo de 10 horas cuando est activo, por  ejemplo, de 7 am a 5 pm, y [employer] de comer despus de su ltima comida del da y beba slo agua.   Periodos de alimentacin ms cortos, de 6 a 8 horas, estn mostrando beneficios en las enfermedades cardacas y en la regulacin del azcar en sangre.  Bebe [employer]! Intente consumir 64 onzas diarias = 8 tazas, ninguna otra bebida es tan saludable! La mejor forma de disfrutar el zumo de fruta de forma saludable es COMIENDO la fruta.

## 2023-04-06 ENCOUNTER — Encounter: Payer: Self-pay | Admitting: Family

## 2023-04-06 ENCOUNTER — Ambulatory Visit (INDEPENDENT_AMBULATORY_CARE_PROVIDER_SITE_OTHER): Payer: 59 | Admitting: Family

## 2023-04-06 VITALS — BP 133/84 | HR 79 | Temp 98.0°F | Ht 63.0 in | Wt 154.8 lb

## 2023-04-06 DIAGNOSIS — Z23 Encounter for immunization: Secondary | ICD-10-CM

## 2023-04-06 DIAGNOSIS — Z1159 Encounter for screening for other viral diseases: Secondary | ICD-10-CM

## 2023-04-06 DIAGNOSIS — Z0000 Encounter for general adult medical examination without abnormal findings: Secondary | ICD-10-CM | POA: Diagnosis not present

## 2023-04-06 DIAGNOSIS — F17218 Nicotine dependence, cigarettes, with other nicotine-induced disorders: Secondary | ICD-10-CM

## 2023-04-06 DIAGNOSIS — F17200 Nicotine dependence, unspecified, uncomplicated: Secondary | ICD-10-CM | POA: Insufficient documentation

## 2023-04-06 DIAGNOSIS — K5790 Diverticulosis of intestine, part unspecified, without perforation or abscess without bleeding: Secondary | ICD-10-CM | POA: Insufficient documentation

## 2023-04-06 LAB — COMPREHENSIVE METABOLIC PANEL
ALT: 10 U/L (ref 0–35)
AST: 13 U/L (ref 0–37)
Albumin: 4.1 g/dL (ref 3.5–5.2)
Alkaline Phosphatase: 58 U/L (ref 39–117)
BUN: 10 mg/dL (ref 6–23)
CO2: 29 meq/L (ref 19–32)
Calcium: 10.5 mg/dL (ref 8.4–10.5)
Chloride: 105 meq/L (ref 96–112)
Creatinine, Ser: 0.91 mg/dL (ref 0.40–1.20)
GFR: 74.39 mL/min (ref >60.00)
Glucose, Bld: 86 mg/dL (ref 70–99)
Potassium: 4.7 meq/L (ref 3.5–5.1)
Sodium: 137 meq/L (ref 135–145)
Total Bilirubin: 0.4 mg/dL (ref 0.2–1.2)
Total Protein: 7.3 g/dL (ref 6.0–8.3)

## 2023-04-06 LAB — LIPID PANEL
Cholesterol: 196 mg/dL (ref 0–200)
HDL: 43.7 mg/dL (ref >39.00)
LDL Cholesterol: 125 mg/dL — ABNORMAL HIGH (ref 0–99)
NonHDL: 151.92
Total CHOL/HDL Ratio: 4
Triglycerides: 134 mg/dL (ref 0.0–149.0)
VLDL: 26.8 mg/dL (ref 0.0–40.0)

## 2023-04-06 LAB — TSH: TSH: 1.18 u[IU]/mL (ref 0.35–5.50)

## 2023-04-06 NOTE — Assessment & Plan Note (Signed)
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

## 2023-04-06 NOTE — Progress Notes (Signed)
Phone [phone]  Subjective:   Patient is a 47 y.o. female presenting for annual physical.    Chief Complaint  Patient presents with   Annual Exam    Fasting w/ labs    See problem oriented charting- ROS- full  review of systems was completed and negative.   The following were reviewed and entered/updated in epic: Past Medical History:  Diagnosis Date   Asthma    Depression    diverticulitis    Endometrial polyp    AND INTRAUTERINE LESIONS   GERD (gastroesophageal reflux disease)    Menorrhagia    Patient Active Problem List   Diagnosis Date Noted   Diverticulosis 04/06/2023   Nicotine dependence 04/06/2023   Past Surgical History:  Procedure Laterality Date   DILATATION & CURETTAGE/HYSTEROSCOPY WITH MYOSURE N/A 02/11/2017   Procedure: DILATATION & CURETTAGE/HYSTEROSCOPY WITH MYOSURE;  Surgeon: Genia Del, MD;  Location: [facility];  Service: Gynecology;  Laterality: N/A;  request 1:00pm OR time  request one hour   Liposunction     REDUCTION MAMMAPLASTY Bilateral 2009   and Liposuction    Family History  Problem Relation Age of Onset   Diabetes Mother    Hypertension Mother    Cancer Mother        endometrial   Pancreatic cancer Mother    Stroke Father    Hypertension Father    Hyperthyroidism Sister    Asthma Sister    Cancer Maternal Aunt        COLON   Colon cancer Maternal Aunt     Medications- reviewed and updated Current Outpatient Medications  Medication Sig Dispense Refill   Acetaminophen (TYLENOL PO) Take by mouth.     famotidine (PEPCID) 40 MG tablet Take 1 tablet (40 mg total) by mouth 2 (two) times daily. 60 tablet 2   varenicline (CHANTIX) 0.5 MG tablet Take 1 tablet (0.5 mg total) by mouth as directed. START with 1 pill daily AFTER eating for 1 week, then increase to 2 pill daily x 1 week, then 2 pill qam, 1 pill qpm x 1week, then 2 pills qam & qpm if needed. 90 tablet 1   No current facility-administered  medications for this visit.    Allergies-reviewed and updated Allergies  Allergen Reactions   Iodine Other (See Comments)    "iodine on skin causes irritation" Other reaction(s): rash    Social History   Social History Narrative   Not on file    Objective:  BP 133/84 (BP Location: Left Arm, Patient Position: Sitting, Cuff Size: Large)   Pulse 79   Temp 98 F (36.7 C) (Temporal)   Ht 5\' 3"  (1.6 m)   Wt 154 lb 12.8 oz (70.2 kg)   SpO2 100%   BMI 27.42 kg/m  Physical Exam Vitals and nursing note reviewed.  Constitutional:      Appearance: Normal appearance.  HENT:     Head: Normocephalic.     Right Ear: Tympanic membrane normal.     Left Ear: Tympanic membrane normal.     Nose: Nose normal.     Mouth/Throat:     Mouth: Mucous membranes are moist.  Eyes:     Pupils: Pupils are equal, round, and reactive to light.  Cardiovascular:     Rate and Rhythm: Normal rate and regular rhythm.  Pulmonary:     Effort: Pulmonary effort is normal.     Breath sounds: Normal breath sounds.  Musculoskeletal:        General:  Normal range of motion.     Cervical back: Normal range of motion.  Lymphadenopathy:     Cervical: No cervical adenopathy.  Skin:    General: Skin is warm and dry.  Neurological:     Mental Status: She is alert.  Psychiatric:        Mood and Affect: Mood normal.        Behavior: Behavior normal.      Assessment and Plan   Health Maintenance counseling: 1. Anticipatory guidance: Patient counseled regarding regular dental exams q6 months, eye exams,  avoiding smoking and second hand smoke, limiting alcohol to 1 beverage per day, no illicit drugs.   2. Risk factor reduction:  Advised patient of need for regular exercise and diet rich with fruits and vegetables to reduce risk of heart attack and stroke. Exercise- walking  .  Wt Readings from Last 3 Encounters:  04/06/23 154 lb 12.8 oz (70.2 kg)  03/21/23 150 lb 3.2 oz (68.1 kg)  01/20/23 151 lb (68.5 kg)    3. Immunizations/screenings/ancillary studies Immunization History  Administered Date(s) Administered   Influenza,inj,Quad PF,6+ Mos 04/06/2023   PFIZER(Purple Top)SARS-COV-2 Vaccination 04/03/2020, 04/24/2020   Health Maintenance Due  Topic Date Due   Hepatitis C Screening  Never done   DTaP/Tdap/Td (1 - Tdap) Never done    4. Cervical cancer screening- due 2027 5. Breast cancer screening-  mammogram normal, done 6. Colon cancer screening - due 2033 7. Skin cancer screening- advised regular sunscreen use. Denies worrisome, changing, or new skin lesions.  8. Birth control/STD check- none/N/A 9. Osteoporosis screening- N/A 10. Alcohol screening: social 11. 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 No future appointments.   Lab/Order associations: fasting   Dulce Sellar, NP

## 2023-04-06 NOTE — Patient Instructions (Addendum)
  Fue muy lindo verte hoy!  Revisar los [place] de su laboratorio a Games developer de [employer].  Para sus odos, puede usar gotas de De[employer] de [employer], instilar 3 gotas en cada odo durante 3 noches y luego, despus de la tercera noche, enjuague los odos con agua y use suavemente un hisopo en la parte exterior del odo para eliminar la cera.  Que tengas una gran semana!    TENGA EN CUENTA:  Si se realiz alguna prueba de laboratorio, infrmenos si no recibe Musician de [address] Es posible que vea sus resultados en MyChart antes de que tengamos la oportunidad de Stage manager, pero le llamaremos una vez que los [place]. Si solicitamos alguna referencia hoy, infrmenos si no recibi noticias de su oficina dentro de la prxima semana.

## 2023-04-07 LAB — HEPATITIS C ANTIBODY: Hepatitis C Ab: NONREACTIVE

## 2023-04-15 NOTE — Addendum Note (Signed)
Addended byZoe Lan on: 04/15/2023 02:12 PM   Modules accepted: Orders

## 2023-04-23 ENCOUNTER — Other Ambulatory Visit: Payer: Self-pay | Admitting: Family

## 2023-04-23 DIAGNOSIS — F17219 Nicotine dependence, cigarettes, with unspecified nicotine-induced disorders: Secondary | ICD-10-CM

## 2023-05-26 ENCOUNTER — Ambulatory Visit: Payer: 59 | Admitting: Family

## 2023-06-21 ENCOUNTER — Other Ambulatory Visit: Payer: Self-pay | Admitting: Family

## 2023-06-21 DIAGNOSIS — K2900 Acute gastritis without bleeding: Secondary | ICD-10-CM

## 2023-07-24 DIAGNOSIS — Z8719 Personal history of other diseases of the digestive system: Secondary | ICD-10-CM | POA: Insufficient documentation

## 2023-12-28 ENCOUNTER — Ambulatory Visit (HOSPITAL_COMMUNITY): Admission: EM | Admit: 2023-12-28 | Discharge: 2023-12-28 | Disposition: A | Discharge status: Home / Self Care

## 2023-12-28 ENCOUNTER — Encounter (HOSPITAL_COMMUNITY): Payer: Self-pay

## 2023-12-28 DIAGNOSIS — J4520 Mild intermittent asthma, uncomplicated: Secondary | ICD-10-CM | POA: Diagnosis not present

## 2023-12-28 DIAGNOSIS — H66012 Acute suppurative otitis media with spontaneous rupture of ear drum, left ear: Secondary | ICD-10-CM | POA: Diagnosis not present

## 2023-12-28 MED ORDER — ALBUTEROL SULFATE HFA 108 (90 BASE) MCG/ACT IN AERS
INHALATION_SPRAY | RESPIRATORY_TRACT | Status: AC
  Filled 2023-12-28: qty 6.7

## 2023-12-28 MED ORDER — ALBUTEROL SULFATE HFA 108 (90 BASE) MCG/ACT IN AERS
2.0000 | INHALATION_SPRAY | Freq: Once | RESPIRATORY_TRACT | Status: AC
  Administered 2023-12-28: 2 via RESPIRATORY_TRACT

## 2023-12-28 MED ORDER — AMOXICILLIN-POT CLAVULANATE 875-125 MG PO TABS: 1.0000 | ORAL_TABLET | Freq: Two times a day (BID) | ORAL | 0 refills | Status: DC

## 2023-12-28 NOTE — Discharge Instructions (Signed)
 Starting Augmentin twice daily for 7 days for ear infection coverage. You can continue to use the ofloxacin drops for additional infection coverage. Use albuterol  inhaler 1 to 2 puffs every 6 hours as needed for any shortness of breath or wheezing. Follow-up with primary care provider or return here as needed.

## 2023-12-28 NOTE — ED Provider Notes (Signed)
 [facility]    CSN: [id number] Arrival date & time: 12/28/23  1436      History   Chief Complaint Chief Complaint  Patient presents with   Ear Fullness    HPI Sherry Hunt is a 48 y.o. female.   Patient presents with left ear pain and fullness x 3 days.  Patient was seen a couple days ago in a different urgent care where she had her ear cleaned out and was prescribed ofloxacin for outer ear infection.  Patient reports that she has been using the drops, but has had increased pain over the last couple days.  Denies fever, congestion, headache, and sore throat.  Patient also reports a history of asthma and states that she is out of her albuterol  inhaler.  Patient states over the last few days she has had some mild episodes of wheezing and shortness of breath.  The history is provided by the patient and medical records.  Ear Fullness    Past Medical History:  Diagnosis Date   Asthma    Depression    diverticulitis    Endometrial polyp    AND INTRAUTERINE LESIONS   GERD (gastroesophageal reflux disease)    Menorrhagia     Patient Active Problem List   Diagnosis Date Noted   Diverticulosis 04/06/2023   Nicotine dependence 04/06/2023    Past Surgical History:  Procedure Laterality Date   DILATATION & CURETTAGE/HYSTEROSCOPY WITH MYOSURE N/A 02/11/2017   Procedure: DILATATION & CURETTAGE/HYSTEROSCOPY WITH MYOSURE;  Surgeon: Lavoie, Marie-Lyne, MD;  Location: [facility];  Service: Gynecology;  Laterality: N/A;  request 1:00pm OR time  request one hour   Liposunction     REDUCTION MAMMAPLASTY Bilateral 2009   and Liposuction    OB History     Gravida  2   Para  2   Term      Preterm      AB      Living  2      SAB      IAB      Ectopic      Multiple      Live Births               Home Medications    Prior to Admission medications   Medication Sig Start Date End Date Taking? Authorizing Provider   amoxicillin -clavulanate (AUGMENTIN ) 875-125 MG tablet Take 1 tablet by mouth every 12 (twelve) hours. 12/28/23  Yes Rosevelt Constable, Srah Ake A, NP  ofloxacin (FLOXIN) 0.3 % OTIC solution Place into the left ear daily. 12/26/23  Yes [provider]  famotidine  (PEPCID ) 40 MG tablet TAKE 1 TABLET BY MOUTH TWICE A DAY 06/21/23   Versa Gore, NP    Family History Family History  Problem Relation Age of Onset   Diabetes Mother    Hypertension Mother    Cancer Mother        endometrial   Pancreatic cancer Mother    Stroke Father    Hypertension Father    Hyperthyroidism Sister    Asthma Sister    Cancer Maternal Aunt        COLON   Colon cancer Maternal Aunt     Social History Social History   Tobacco Use   Smoking status: Every Day    Current packs/day: 0.00    Average packs/day: 0.5 packs/day for 10.0 years (5.0 ttl pk-yrs)    Types: Cigarettes    Start date: 12/31/2008    Last attempt to  quit: 01/01/2019    Years since quitting: 4.9   Smokeless tobacco: Never   Tobacco comments:    10 CIG. PER DAY. Restarted smoking 06/2019  Vaping Use   Vaping status: Never Used  Substance Use Topics   Alcohol use: Yes    Comment: OCCASIONAL   Drug use: No     Allergies   Iodine   Review of Systems Review of Systems  Per HPI  Physical Exam Triage Vital Signs ED Triage Vitals  Encounter Vitals Group     BP 12/28/23 1600 (!) 157/99     Systolic BP Percentile --      Diastolic BP Percentile --      Pulse Rate 12/28/23 1600 85     Resp 12/28/23 1600 16     Temp 12/28/23 1600 97.8 F (36.6 C)     Temp Source 12/28/23 1600 Oral     SpO2 12/28/23 1600 94 %     Weight --      Height --      Head Circumference --      Peak Flow --      Pain Score 12/28/23 1601 2     Pain Loc --      Pain Education --      Exclude from Growth Chart --    No data found.  Updated Vital Signs BP (!) 157/99 (BP Location: Right Arm)   Pulse 85   Temp 97.8 F (36.6 C) (Oral)   Resp  16   LMP  (LMP Unknown)   SpO2 94%   Visual Acuity Right Eye Distance:   Left Eye Distance:   Bilateral Distance:    Right Eye Near:   Left Eye Near:    Bilateral Near:     Physical Exam Vitals and nursing note reviewed.  Constitutional:      General: She is awake. She is not in acute distress.    Appearance: Normal appearance. She is well-developed and well-groomed. She is not ill-appearing.  HENT:     Right Ear: Tympanic membrane, ear canal and external ear normal.     Left Ear: Drainage and swelling present. Tympanic membrane is perforated.     Nose: Nose normal.     Mouth/Throat:     Mouth: Mucous membranes are moist.     Pharynx: Oropharynx is clear.  Cardiovascular:     Rate and Rhythm: Normal rate and regular rhythm.  Pulmonary:     Effort: Pulmonary effort is normal.     Breath sounds: Examination of the left-upper field reveals wheezing. Wheezing present.     Comments: Very mild expiratory wheeze noted to left upper lobe Skin:    General: Skin is warm and dry.  Neurological:     Mental Status: She is alert.  Psychiatric:        Behavior: Behavior is cooperative.      UC Treatments / Results  Labs (all labs ordered are listed, but only abnormal results are displayed) Labs Reviewed - No data to display  EKG   Radiology No results found.  Procedures Procedures (including critical care time)  Medications Ordered in UC Medications  albuterol  (VENTOLIN  HFA) 108 (90 Base) MCG/ACT inhaler 2 puff (2 puffs Inhalation Given 12/28/23 1640)    Initial Impression / Assessment and Plan / UC Course  I have reviewed the triage vital signs and the nursing notes.  Pertinent labs & imaging results that were available during my care of the patient were reviewed by  me and considered in my medical decision making (see chart for details).     Patient is well-appearing.  Vitals are stable.  Upon assessment left TM is perforated drainage and mild swelling noted to the  left ear canal.  Upon auscultation there is a very mild expiratory wheeze noted to the left upper lobe.  Given albuterol  inhaler in clinic.  Prescribed Augmentin twice daily for otitis media coverage.  Recommended continuing ofloxacin drops for additional infection coverage.  Discussed when to use albuterol  inhaler.  Discussed follow-up and return precautions. Final Clinical Impressions(s) / UC Diagnoses   Final diagnoses:  Non-recurrent acute suppurative otitis media of left ear with spontaneous rupture of tympanic membrane  Mild intermittent asthma without complication     Discharge Instructions      Starting Augmentin twice daily for 7 days for ear infection coverage. You can continue to use the ofloxacin drops for additional infection coverage. Use albuterol  inhaler 1 to 2 puffs every 6 hours as needed for any shortness of breath or wheezing. Follow-up with primary care provider or return here as needed.  ED Prescriptions     Medication Sig Dispense Auth. Provider   amoxicillin-clavulanate (AUGMENTIN) 875-125 MG tablet Take 1 tablet by mouth every 12 (twelve) hours. 14 tablet Levora Reas A, NP      PDMP not reviewed this encounter.   Levora Reas A, NP 12/28/23 [phone]

## 2023-12-28 NOTE — ED Triage Notes (Signed)
 Patient here today with c/o left ear pain and fullness X 3 days. Patient started taking Ofloxacin yesterday but appears worse.

## 2023-12-31 ENCOUNTER — Telehealth: Admitting: Nurse Practitioner

## 2023-12-31 DIAGNOSIS — H66012 Acute suppurative otitis media with spontaneous rupture of ear drum, left ear: Secondary | ICD-10-CM | POA: Diagnosis not present

## 2023-12-31 MED ORDER — IPRATROPIUM BROMIDE 0.03 % NA SOLN: 2.0000 | Freq: Two times a day (BID) | NASAL | 0 refills | Status: DC

## 2023-12-31 MED ORDER — IBUPROFEN 600 MG PO TABS: 600.0000 mg | ORAL_TABLET | Freq: Three times a day (TID) | ORAL | 0 refills | Status: DC | PRN

## 2023-12-31 NOTE — Progress Notes (Signed)
 Virtual Visit Consent   Sherry Hunt, you are scheduled for a virtual visit with a [facility] provider today. Just as with appointments in the office, your consent must be obtained to participate. Your consent will be active for this visit and any virtual visit you may have with one of our providers in the next 365 days. If you have a MyChart account, a copy of this consent can be sent to you electronically.  As this is a virtual visit, video technology does not allow for your provider to perform a traditional examination. This may limit your provider's ability to fully assess your condition. If your provider identifies any concerns that need to be evaluated in person or the need to arrange testing (such as labs, EKG, etc.), we will make arrangements to do so. Although advances in technology are sophisticated, we cannot ensure that it will always work on either your end or our end. If the connection with a video visit is poor, the visit may have to be switched to a telephone visit. With either a video or telephone visit, we are not always able to ensure that we have a secure connection.  By engaging in this virtual visit, you consent to the provision of healthcare and authorize for your insurance to be billed (if applicable) for the services provided during this visit. Depending on your insurance coverage, you may receive a charge related to this service.  I need to obtain your verbal consent now. Are you willing to proceed with your visit today? Sherry Hunt has provided verbal consent on 12/31/2023 for a virtual visit (video or telephone). Collins Dean, NP  Date: 12/31/2023 4:41 PM   Virtual Visit via Video Note   I, Collins Dean, connected with  Sherry Hunt  ([id number], 16-Jan-1976) on 12/31/23 at  4:30 PM EDT by a video-enabled telemedicine application and verified that I am speaking with the correct person using two  identifiers.  Location: Patient: Virtual Visit Location Patient: Home Provider: Virtual Visit Location Provider: Home Office   I discussed the limitations of evaluation and management by telemedicine and the availability of in person appointments. The patient expressed understanding and agreed to proceed.    History of Present Illness: Sherry Hunt is a 48 y.o. who identifies as a female who was assigned female at birth, and is being seen today for left ear pain.  Sherry Hunt was recently seen on 12-28-2023 for Non-recurrent acute suppurative otitis media of left ear with spontaneous rupture of tympanic membrane  and treated with PO abx and abx ear drops. She is currently taking medications as prescribed but reports persistent pain and fullness in left ear.    Problems:  Patient Active Problem List   Diagnosis Date Noted   Diverticulosis 04/06/2023   Nicotine dependence 04/06/2023    Allergies:  Allergies  Allergen Reactions   Iodine Other (See Comments)    "iodine on skin causes irritation" Other reaction(s): rash   Medications:  Current Outpatient Medications:    ipratropium (ATROVENT) 0.03 % nasal spray, Place 2 sprays into both nostrils every 12 (twelve) hours., Disp: 30 mL, Rfl: 0   amoxicillin -clavulanate (AUGMENTIN ) 875-125 MG tablet, Take 1 tablet by mouth every 12 (twelve) hours., Disp: 14 tablet, Rfl: 0   famotidine  (PEPCID ) 40 MG tablet, TAKE 1 TABLET BY MOUTH TWICE A DAY, Disp: 180 tablet, Rfl: 0   ofloxacin (FLOXIN) 0.3 % OTIC solution, Place into the left ear daily., Disp: , Rfl:  Observations/Objective: Patient is well-developed, well-nourished in no acute distress.  Resting comfortably at home.  Head is normocephalic, atraumatic.  No labored breathing.  Speech is clear and coherent with logical content.  Patient is alert and oriented at baseline.    Assessment and Plan: 1. Non-recurrent acute suppurative otitis media of left ear with  spontaneous rupture of tympanic membrane (Primary) - ipratropium (ATROVENT) 0.03 % nasal spray; Place 2 sprays into both nostrils every 12 (twelve) hours.  Dispense: 30 mL; Refill: 0 Continue ear drops and po abx  Follow Up Instructions: I discussed the assessment and treatment plan with the patient. The patient was provided an opportunity to ask questions and all were answered. The patient agreed with the plan and demonstrated an understanding of the instructions.  A copy of instructions were sent to the patient via MyChart unless otherwise noted below.    The patient was advised to call back or seek an in-person evaluation if the symptoms worsen or if the condition fails to improve as anticipated.    Guerino Caporale W Amanie Mcculley, NP

## 2023-12-31 NOTE — Patient Instructions (Addendum)
  Sherry Hunt, thank you for joining Collins Dean, NP for today's virtual visit.  While this provider is not your primary care provider (PCP), if your PCP is located in our provider database this encounter information will be shared with them immediately following your visit.   A [facility] MyChart account gives you access to today's visit and all your visits, tests, and labs performed at [facility] " click here if you don't have a [facility] MyChart account or go to [email or web address]  Consent: (Patient) Sherry Hunt provided verbal consent for this virtual visit at the beginning of the encounter.  Current Medications:  Current Outpatient Medications:    ibuprofen (ADVIL) 600 MG tablet, Take 1 tablet (600 mg total) by mouth every 8 (eight) hours as needed for moderate pain (pain score 4-6)., Disp: 60 tablet, Rfl: 0   ipratropium (ATROVENT) 0.03 % nasal spray, Place 2 sprays into both nostrils every 12 (twelve) hours., Disp: 30 mL, Rfl: 0   amoxicillin -clavulanate (AUGMENTIN ) 875-125 MG tablet, Take 1 tablet by mouth every 12 (twelve) hours., Disp: 14 tablet, Rfl: 0   famotidine  (PEPCID ) 40 MG tablet, TAKE 1 TABLET BY MOUTH TWICE A DAY, Disp: 180 tablet, Rfl: 0   ofloxacin (FLOXIN) 0.3 % OTIC solution, Place into the left ear daily., Disp: , Rfl:    Medications ordered in this encounter:  Meds ordered this encounter  Medications   ipratropium (ATROVENT) 0.03 % nasal spray    Sig: Place 2 sprays into both nostrils every 12 (twelve) hours.    Dispense:  30 mL    Refill:  0    Supervising Provider:   Corine Dice [[id number]]   ibuprofen (ADVIL) 600 MG tablet    Sig: Take 1 tablet (600 mg total) by mouth every 8 (eight) hours as needed for moderate pain (pain score 4-6).    Dispense:  60 tablet    Refill:  0    Supervising Provider:   Corine Dice [[id number]]     *If you need refills on other medications prior to your next  appointment, please contact your pharmacy*  Follow-Up: Call back or seek an in-person evaluation if the symptoms worsen or if the condition fails to improve as anticipated.  [facility] [phone]   If you have been instructed to have an in-person evaluation today at a local Urgent Care facility, please use the link below. It will take you to a list of all of our available [facility], including address, phone number and hours of operation. Please do not delay care.  [facility]  If you or a family member do not have a primary care provider, use the link below to schedule a visit and establish care. When you choose a [facility] primary care physician or advanced practice provider, you gain a long-term partner in health. Find a Primary Care Provider  Learn more about [facility]'s in-office and virtual care options: [facility] Get Care Now

## 2024-01-01 ENCOUNTER — Encounter (HOSPITAL_COMMUNITY): Payer: Self-pay

## 2024-01-01 ENCOUNTER — Ambulatory Visit (HOSPITAL_COMMUNITY)
Admission: EM | Admit: 2024-01-01 | Discharge: 2024-01-01 | Disposition: A | Discharge status: Home / Self Care | Attending: Physician Assistant | Admitting: Physician Assistant

## 2024-01-01 DIAGNOSIS — H6092 Unspecified otitis externa, left ear: Secondary | ICD-10-CM | POA: Diagnosis not present

## 2024-01-01 DIAGNOSIS — H6122 Impacted cerumen, left ear: Secondary | ICD-10-CM

## 2024-01-01 NOTE — Discharge Instructions (Signed)
 Good to meet you today. A large amount of wax / infection was removed from your left ear today. Please finish the course of Augmentin . Please use the Ofloxacin ear drops for at least another 3 days. Tylenol  or ibuprofen as needed for pain.  Follow up with Trevor Fudge this week if worse or not improving.

## 2024-01-01 NOTE — ED Triage Notes (Addendum)
 Patient c/o left ear discomfort and pressure x 12 days. Patient was seen on 5/28 for the same. Patient states she was prescribed ear gtts and an antibiotic.  Patient also reports that she was given a prescription for a nasal spray yesterday, but is not helping.

## 2024-01-01 NOTE — ED Provider Notes (Signed)
 [facility]   MRN: [id number] DOB: Sep 17, 1975  Subjective:   Sherry Hunt is a 48 y.o. female presenting for left ear discomfort and pressure.  Patient reports that she feels like there is a buildup of wax or equivalent in her ear.  She states that she is currently taking a course of Augmentin .  She also has antibiotic eardrops at home.  She was recently seen for the same on 12/28/2023.  She denies any pain.  She denies any loss of hearing.  She denies any headache or dizziness.  No ringing in the ears.  No other symptoms at this time.  No current facility-administered medications for this encounter.  Current Outpatient Medications:    amoxicillin -clavulanate (AUGMENTIN ) 875-125 MG tablet, Take 1 tablet by mouth every 12 (twelve) hours., Disp: 14 tablet, Rfl: 0   famotidine  (PEPCID ) 40 MG tablet, TAKE 1 TABLET BY MOUTH TWICE A DAY, Disp: 180 tablet, Rfl: 0   ibuprofen (ADVIL) 600 MG tablet, Take 1 tablet (600 mg total) by mouth every 8 (eight) hours as needed for moderate pain (pain score 4-6)., Disp: 60 tablet, Rfl: 0   ipratropium (ATROVENT) 0.03 % nasal spray, Place 2 sprays into both nostrils every 12 (twelve) hours., Disp: 30 mL, Rfl: 0   ofloxacin (FLOXIN) 0.3 % OTIC solution, Place into the left ear daily., Disp: , Rfl:    Allergies  Allergen Reactions   Iodine Other (See Comments)    "iodine on skin causes irritation" Other reaction(s): rash    Past Medical History:  Diagnosis Date   Asthma    Depression    diverticulitis    Endometrial polyp    AND INTRAUTERINE LESIONS   GERD (gastroesophageal reflux disease)    Menorrhagia      Past Surgical History:  Procedure Laterality Date   DILATATION & CURETTAGE/HYSTEROSCOPY WITH MYOSURE N/A 02/11/2017   Procedure: DILATATION & CURETTAGE/HYSTEROSCOPY WITH MYOSURE;  Surgeon: Lavoie, Marie-Lyne, MD;  Location: [facility];  Service: Gynecology;  Laterality: N/A;  request 1:00pm OR  time  request one hour   Liposunction     REDUCTION MAMMAPLASTY Bilateral 2009   and Liposuction    Family History  Problem Relation Age of Onset   Diabetes Mother    Hypertension Mother    Cancer Mother        endometrial   Pancreatic cancer Mother    Stroke Father    Hypertension Father    Hyperthyroidism Sister    Asthma Sister    Cancer Maternal Aunt        COLON   Colon cancer Maternal Aunt     Social History   Tobacco Use   Smoking status: Every Day    Current packs/day: 0.00    Average packs/day: 0.5 packs/day for 10.0 years (5.0 ttl pk-yrs)    Types: Cigarettes    Start date: 12/31/2008    Last attempt to quit: 01/01/2019    Years since quitting: 5.0   Smokeless tobacco: Never   Tobacco comments:    10 CIG. PER DAY. Restarted smoking 06/2019  Vaping Use   Vaping status: Never Used  Substance Use Topics   Alcohol use: Yes    Comment: OCCASIONAL   Drug use: No    ROS REFER TO HPI FOR PERTINENT POSITIVES AND NEGATIVES   Objective:   Vitals: BP (!) 142/90 (BP Location: Right Arm)   Pulse 95   Temp 98 F (36.7 C) (Oral)   Resp 16  LMP  (LMP Unknown)   SpO2 96%   Physical Exam Vitals and nursing note reviewed.  Constitutional:      General: She is not in acute distress.    Appearance: Normal appearance. She is not ill-appearing.  HENT:     Head: Normocephalic.     Right Ear: Tympanic membrane, ear canal and external ear normal.     Left Ear: Tympanic membrane, ear canal and external ear normal. There is impacted cerumen.     Nose: No congestion.     Mouth/Throat:     Mouth: Mucous membranes are moist.     Pharynx: No oropharyngeal exudate or posterior oropharyngeal erythema.  Eyes:     Extraocular Movements: Extraocular movements intact.     Conjunctiva/sclera: Conjunctivae normal.     Pupils: Pupils are equal, round, and reactive to light.  Cardiovascular:     Rate and Rhythm: Normal rate and regular rhythm.     Pulses: Normal pulses.      Heart sounds: Normal heart sounds. No murmur heard. Pulmonary:     Effort: Pulmonary effort is normal. No respiratory distress.     Breath sounds: Normal breath sounds. No wheezing.  Musculoskeletal:     Cervical back: Normal range of motion.  Skin:    General: Skin is warm.  Neurological:     Mental Status: She is alert and oriented to person, place, and time.  Psychiatric:        Mood and Affect: Mood normal.        Behavior: Behavior normal.     No results found for this or any previous visit (from the past 24 hours).  Assessment and Plan :   PDMP not reviewed this encounter.  1. Otitis externa of left ear, unspecified chronicity, unspecified type   2. Impacted cerumen, left ear    Finish the course of Augmentin  and Ofloxacin ear drops. Tylenol  or ibuprofen as needed. Monitor symptoms.  Procedure: Ceruminosis is noted. Removal procedure explained and verbal consent obtained from patient. Wax is removed by syringing and manual debridement from L ear canal. Pt tolerated procedure well. Instructions for home care to prevent wax buildup are given.  RTC precautions advised.      AllwardtDeleta Felix, PA-C 01/01/24 1915

## 2024-01-03 ENCOUNTER — Ambulatory Visit: Admitting: Family

## 2024-01-03 ENCOUNTER — Telehealth: Payer: Self-pay | Admitting: Family

## 2024-01-03 ENCOUNTER — Encounter: Payer: Self-pay | Admitting: Family

## 2024-01-03 VITALS — BP 139/98 | HR 74 | Temp 97.5°F | Ht 63.0 in | Wt 152.0 lb

## 2024-01-03 DIAGNOSIS — H9202 Otalgia, left ear: Secondary | ICD-10-CM

## 2024-01-03 DIAGNOSIS — F17218 Nicotine dependence, cigarettes, with other nicotine-induced disorders: Secondary | ICD-10-CM | POA: Diagnosis not present

## 2024-01-03 DIAGNOSIS — R911 Solitary pulmonary nodule: Secondary | ICD-10-CM | POA: Diagnosis not present

## 2024-01-03 DIAGNOSIS — N951 Menopausal and female climacteric states: Secondary | ICD-10-CM | POA: Diagnosis not present

## 2024-01-03 DIAGNOSIS — I83812 Varicose veins of left lower extremities with pain: Secondary | ICD-10-CM | POA: Insufficient documentation

## 2024-01-03 NOTE — Assessment & Plan Note (Signed)
 Confirmed menopause with symptoms including mood swings, hot flashes, and vaginal dryness. Concerns about hormone therapy due to smoking and clot risk. Non-hormonal options considered. - Discuss non-hormonal treatment options with gynecologist. - Consider over-the-counter options like Estroven for symptom relief. - Recommend using over-the-counter lubricants for vaginal dryness. - Explore non-hormonal medications for mood stabilization and symptom management.

## 2024-01-03 NOTE — Progress Notes (Signed)
 Patient ID: Sherry Hunt, female    DOB: 1976-01-21, 48 y.o.   MRN: [id number]  Chief Complaint  Patient presents with   Ear Pain    Pt c/o left ear pain, seen in UC on 5/26abx drop and amoxicillin . Pt still c/o fullness and pain.   Discussed the use of AI scribe software for clinical note transcription with the patient, who gave verbal consent to proceed.  History of Present Illness Sherry Hunt is a 48 year old female who presents with persistent left ear pain and fullness.  She has experienced left ear pain and fullness for 15 days. She visited urgent care twice, received ear cleaning, and was prescribed augmentin  and antibiotic ear drops. Despite these treatments, she continues to experience pain, fullness, and pressure in the ear. The ear feels blocked with a 'cooling' sensation. She has one day left of her amoxicillin  course and has completed a seven-day course of ear drops. During her first urgent care visit, she was informed of a hole in her eardrum. The ear cleaning provided temporary relief, but symptoms returned the following day. She has used a nasal spray intermittently to aid sinus drainage.  She also has 2 small lumps on her thigh and states if she stands for a long time her legs become tired, achy.  She also is having menopausal sx like hot flashes, night sweats, moodiness, irritability, vaginal dryness. She has seen her GYN and labs indicate she is in full menopause/ Assessment & Plan Left Otitis Media - Persistent left ear pain and fullness despite treatment. Whitish-gray wet drainage noted in ear canal, not impacted, but unable to visualize TM well. Referral to ENT planned for further evaluation. - Refer to ENT specialist for further evaluation. - Advise warm water irrigation daily to prevent wax buildup, avoiding Q-tips deep in the ear canal. - Instruct to avoid over-the-counter ear drops, alcohol, and hydrogen peroxide due to possible tympanic  membrane perforation. - Schedule follow-up if symptoms persist or worsen.  Varicose Veins Palpable leg lumps approx 1.5cm in 2 different areas on left lateral thigh most likely varicose veins, not raised above skin, no discoloration noted, exacerbated by prolonged standing. No signs of deep vein thrombosis. Conservative management recommended. - Recommend wearing compression socks to improve circulation if standing or walking for extended periods. - Advise regular exercise to promote circulation. - Consider referral to vascular specialist if symptoms worsen or become painful.  Menopause Confirmed menopause with symptoms including mood swings, hot flashes, and vaginal dryness. Concerns about hormone therapy due to smoking and clot risk. Non-hormonal options considered. - Discuss non-hormonal treatment options with gynecologist. - Consider over-the-counter options like Estroven for symptom relief. - Recommend using over-the-counter lubricants for vaginal dryness. - Explore non-hormonal medications for mood stabilization and symptom management. - F/U prn if needed after seeing GYN  Lung nodule - Found on previous CT scan in 2023, one nodule measuring 3mm, had repeat abdominal CT done via [facility] during an ED visit and told the nodule now measured 4mm. She is wanting to have referral to pulmonology to be monitored. - Refer to Pulmonology - Continue to advise on complete smoking cessation  Nicotine dependence - Smoker with increased risk for conditions including blood clots. Smoking cessation advised. She reports generic Chantix  did not work for her. - Advise smoking cessation to reduce health risks and improve overall health. Handouts provided for resources.  Subjective:     Outpatient Medications Prior to Visit  Medication Sig Dispense Refill  famotidine  (PEPCID ) 40 MG tablet TAKE 1 TABLET BY MOUTH TWICE A DAY 180 tablet 0   ibuprofen (ADVIL) 600 MG tablet Take 1 tablet (600 mg  total) by mouth every 8 (eight) hours as needed for moderate pain (pain score 4-6). 60 tablet 0   ipratropium (ATROVENT) 0.03 % nasal spray Place 2 sprays into both nostrils every 12 (twelve) hours. 30 mL 0   amoxicillin -clavulanate (AUGMENTIN ) 875-125 MG tablet Take 1 tablet by mouth every 12 (twelve) hours. (Patient not taking: Reported on 01/03/2024) 14 tablet 0   ofloxacin (FLOXIN) 0.3 % OTIC solution Place into the left ear daily. (Patient not taking: Reported on 01/03/2024)     No facility-administered medications prior to visit.   Past Medical History:  Diagnosis Date   Asthma    Depression    diverticulitis    Endometrial polyp    AND INTRAUTERINE LESIONS   GERD (gastroesophageal reflux disease)    Menorrhagia    Past Surgical History:  Procedure Laterality Date   DILATATION & CURETTAGE/HYSTEROSCOPY WITH MYOSURE N/A 02/11/2017   Procedure: DILATATION & CURETTAGE/HYSTEROSCOPY WITH MYOSURE;  Surgeon: Lavoie, Marie-Lyne, MD;  Location: [facility];  Service: Gynecology;  Laterality: N/A;  request 1:00pm OR time  request one hour   Liposunction     REDUCTION MAMMAPLASTY Bilateral 2009   and Liposuction   Allergies  Allergen Reactions   Iodine Other (See Comments)    "iodine on skin causes irritation" Other reaction(s): rash      Objective:    Physical Exam Vitals and nursing note reviewed.  Constitutional:      Appearance: Normal appearance.  HENT:     Left Ear: Drainage (whitish drainage, unable to visualize TM) and tenderness present.  Cardiovascular:     Rate and Rhythm: Normal rate and regular rhythm.     Comments: 2 small 1.5cm lumps palpable on left lateral thigh, no discoloration or pain  Pulmonary:     Effort: Pulmonary effort is normal.     Breath sounds: Normal breath sounds.  Musculoskeletal:        General: Normal range of motion.  Skin:    General: Skin is warm and dry.  Neurological:     Mental Status: She is alert.  Psychiatric:         Mood and Affect: Mood normal.        Behavior: Behavior normal.   BP (!) 139/98 (BP Location: Left Arm, Patient Position: Sitting, Cuff Size: Large)   Pulse 74   Temp (!) 97.5 F (36.4 C) (Temporal)   Ht 5\' 3"  (1.6 m)   Wt 152 lb (68.9 kg)   LMP  (LMP Unknown)   BMI 26.93 kg/m  Wt Readings from Last 3 Encounters:  01/03/24 152 lb (68.9 kg)  04/06/23 154 lb 12.8 oz (70.2 kg)  03/21/23 150 lb 3.2 oz (68.1 kg)      Versa Gore, NP

## 2024-01-03 NOTE — Assessment & Plan Note (Signed)
 Palpable leg lumps approx 1.5cm in 2 different areas on left lateral thigh most likely varicose veins, not raised above skin, no discoloration noted, exacerbated by prolonged standing. No signs of deep vein thrombosis. Conservative management recommended. - Recommend wearing compression socks to improve circulation if standing or walking for extended periods. - Advise regular exercise to promote circulation. - Consider referral to vascular specialist if symptoms worsen or become painful.

## 2024-01-03 NOTE — Telephone Encounter (Signed)
 Pt is asking for you to look at records from 07/24/23 visit with [facility].

## 2024-01-03 NOTE — Assessment & Plan Note (Addendum)
 Found on previous CT scan in 2023, one nodule measuring 3mm, had repeat abdominal CT done via [facility] during an ED visit and told the nodule now measured 4mm. She is wanting to have referral to pulmonology to be monitored. - Refer to Pulmonology - Continue to advise on complete smoking cessation

## 2024-01-03 NOTE — Assessment & Plan Note (Signed)
 Smoker with increased risk for conditions including blood clots. Smoking cessation advised. She reports generic Chantix  did not work for her. - Advise smoking cessation to reduce health risks and improve overall health. Handouts provided for resources.

## 2024-01-03 NOTE — Patient Instructions (Addendum)
 It was very nice to see you today!   Estroven is over the counter for Menopause.  Black cohosh (400mg ) is an herb you can take 3 times per day that can help reduce the number of hot flashes and intensity. Also remember that drinking caffeine can worsen the hot flashes. I gave you samples of vaginal lubricant to use during intercourse. You can also find similar items over the counter, for example, KY jelly. Ask your GYN about other prescription options to help your symptoms Maree Shames).  See the handouts attached for support in quitting smoking!      PLEASE NOTE:  If you had any lab tests please let us  know if you have not heard back within a few days. You may see your results on MyChart before we have a chance to review them but we will give you a call once they are reviewed by us . If we ordered any referrals today, please let us  know if you have not heard from their office within the next week.

## 2024-01-04 ENCOUNTER — Encounter: Payer: Self-pay | Admitting: Family

## 2024-01-04 ENCOUNTER — Other Ambulatory Visit: Payer: Self-pay | Admitting: Family

## 2024-01-04 DIAGNOSIS — H60392 Other infective otitis externa, left ear: Secondary | ICD-10-CM

## 2024-01-04 MED ORDER — CIPROFLOXACIN-DEXAMETHASONE 0.3-0.1 % OT SUSP
4.0000 [drp] | Freq: Two times a day (BID) | OTIC | 0 refills | Status: DC
Start: 2024-01-04 — End: 2024-01-09

## 2024-01-09 ENCOUNTER — Encounter (HOSPITAL_COMMUNITY): Payer: Self-pay | Admitting: Emergency Medicine

## 2024-01-09 ENCOUNTER — Ambulatory Visit (HOSPITAL_COMMUNITY)
Admission: EM | Admit: 2024-01-09 | Discharge: 2024-01-09 | Disposition: A | Discharge status: Home / Self Care | Attending: Internal Medicine | Admitting: Internal Medicine

## 2024-01-09 DIAGNOSIS — H9212 Otorrhea, left ear: Secondary | ICD-10-CM

## 2024-01-09 DIAGNOSIS — H9202 Otalgia, left ear: Secondary | ICD-10-CM | POA: Diagnosis not present

## 2024-01-09 DIAGNOSIS — H6122 Impacted cerumen, left ear: Secondary | ICD-10-CM

## 2024-01-09 NOTE — ED Provider Notes (Signed)
 [facility]    CSN: [id number] Arrival date & time: 01/09/24  1539      History   Chief Complaint Chief Complaint  Patient presents with   Otalgia    HPI Sherry Hunt is a 48 y.o. female recurrent L ear  stuffiness since this am. She denies pain or fever. Was dont well while on the cipro  otic gtts. Has not heard from ENT yet since she was told they were going to give her a call. This started about 20 days ago. She visited urgent care twice, received ear cleaning, and was prescribed augmentin  and antibiotic ear drops. Despite these treatments, she continues to experience pain, fullness, and pressure in the ear. The ear feels blocked with a 'cooling' sensation. She has one day left of her amoxicillin  course and has completed a seven-day course of ear drops. During her first urgent care visit, she was informed of a hole in her eardrum. The ear cleaning provided temporary relief, but symptoms returned the following day. She has used a nasal spray intermittently to aid sinus drainage but did not help her ear.   Past Medical History:  Diagnosis Date   Asthma    Depression    diverticulitis    Endometrial polyp    AND INTRAUTERINE LESIONS   GERD (gastroesophageal reflux disease)    Menorrhagia     Patient Active Problem List   Diagnosis Date Noted   Postmenopausal disorder 01/03/2024   Lung nodule 01/03/2024   Varicose veins of left lower extremity with pain 01/03/2024   Diverticulosis 04/06/2023   Nicotine dependence 04/06/2023    Past Surgical History:  Procedure Laterality Date   DILATATION & CURETTAGE/HYSTEROSCOPY WITH MYOSURE N/A 02/11/2017   Procedure: DILATATION & CURETTAGE/HYSTEROSCOPY WITH MYOSURE;  Surgeon: Lavoie, Marie-Lyne, MD;  Location: [facility];  Service: Gynecology;  Laterality: N/A;  request 1:00pm OR time  request one hour   Liposunction     REDUCTION MAMMAPLASTY Bilateral 2009   and Liposuction    OB History      Gravida  2   Para  2   Term      Preterm      AB      Living  2      SAB      IAB      Ectopic      Multiple      Live Births               Home Medications    Prior to Admission medications   Medication Sig Start Date End Date Taking? Authorizing Provider  famotidine  (PEPCID ) 40 MG tablet TAKE 1 TABLET BY MOUTH TWICE A DAY 06/21/23   Versa Gore, NP  ibuprofen  (ADVIL ) 600 MG tablet Take 1 tablet (600 mg total) by mouth every 8 (eight) hours as needed for moderate pain (pain score 4-6). 12/31/23   Fleming, Zelda W, NP  ipratropium (ATROVENT ) 0.03 % nasal spray Place 2 sprays into both nostrils every 12 (twelve) hours. 12/31/23   Collins Dean, NP    Family History Family History  Problem Relation Age of Onset   Diabetes Mother    Hypertension Mother    Cancer Mother        endometrial   Pancreatic cancer Mother    Stroke Father    Hypertension Father    Hyperthyroidism Sister    Asthma Sister    Cancer Maternal Aunt        COLON  Colon cancer Maternal Aunt     Social History Social History   Tobacco Use   Smoking status: Every Day    Current packs/day: 0.00    Average packs/day: 0.5 packs/day for 10.0 years (5.0 ttl pk-yrs)    Types: Cigarettes    Start date: 12/31/2008    Last attempt to quit: 01/01/2019    Years since quitting: 5.0   Smokeless tobacco: Never   Tobacco comments:    10 CIG. PER DAY. Restarted smoking 06/2019  Vaping Use   Vaping status: Never Used  Substance Use Topics   Alcohol use: Yes    Comment: OCCASIONAL   Drug use: No     Allergies   Iodine   Review of Systems Review of Systems  As noted in HPI  Physical Exam Triage Vital Signs ED Triage Vitals  Encounter Vitals Group     BP 01/09/24 1620 (!) 147/89     Systolic BP Percentile --      Diastolic BP Percentile --      Pulse Rate 01/09/24 1620 84     Resp 01/09/24 1620 17     Temp 01/09/24 1620 97.9 F (36.6 C)     Temp Source 01/09/24  1620 Oral     SpO2 01/09/24 1620 96 %     Weight --      Height --      Head Circumference --      Peak Flow --      Pain Score 01/09/24 1619 4     Pain Loc --      Pain Education --      Exclude from Growth Chart --    No data found.  Updated Vital Signs BP (!) 147/89 (BP Location: Left Arm)   Pulse 84   Temp 97.9 F (36.6 C) (Oral)   Resp 17   LMP  (LMP Unknown)   SpO2 96%   Visual Acuity Right Eye Distance:   Left Eye Distance:   Bilateral Distance:    Right Eye Near:   Left Eye Near:    Bilateral Near:     Physical Exam Vitals and nursing note reviewed.  Constitutional:      General: She is not in acute distress.    Appearance: She is normal weight. She is not toxic-appearing.  HENT:     Left Ear: External ear normal. There is no impacted cerumen.     Ears:     Comments: Canal had white matter and I could not see the TM. After the lavage the TM was still not visible since it had white matter on it with bobbles. The canal is not red or swollen and not tender. It was noted in one of the exams that pt was told she has a hole in the L TM.     Nose: Nose normal.  Eyes:     General: No scleral icterus.    Conjunctiva/sclera: Conjunctivae normal.  Pulmonary:     Effort: Pulmonary effort is normal.  Musculoskeletal:        General: Normal range of motion.     Cervical back: Neck supple.  Lymphadenopathy:     Cervical: No cervical adenopathy.  Skin:    General: Skin is warm and dry.  Neurological:     Mental Status: She is alert and oriented to person, place, and time.  Psychiatric:        Mood and Affect: Mood normal.        Behavior:  Behavior normal.        Thought Content: Thought content normal.        Judgment: Judgment normal.    UC Treatments / Results  Labs (all labs ordered are listed, but only abnormal results are displayed) Labs Reviewed  AEROBIC/ANAEROBIC CULTURE W GRAM STAIN (SURGICAL/DEEP WOUND)  AEROBIC CULTURE W GRAM STAIN (SUPERFICIAL  SPECIMEN)  CULTURE, FUNGUS WITHOUT SMEAR    EKG   Radiology No results found.  Procedures Procedures (including critical care time)  Medications Ordered in UC Medications - No data to display  Initial Impression / Assessment and Plan / UC Course  I have reviewed the triage vital signs and the nursing notes.  Recurrent L ear stuffiness   Her L ear was irrigated and a light green matter which was like cottage cheese was removed. I obtained samples to send for bacterial and fungal cultures.  She was given ENT phone number to make apt. If her insurance does not require to be referred by PCP.  She was advised to keep the ear canal dry, and do rinses with mixed white vinegar and distilled water 1:1 as noted in instructions. She is to come back if she develops pain or fever.  We will inform her when the results come back.     Final Clinical Impressions(s) / UC Diagnoses   Final diagnoses:  Left ear pain  Otorrhea of left ear     Discharge Instructions      Diluya vinagre blanco con agua destilada 50% de cada uno and enjuague su oido todos los dias Porque le vieron on [employer] en to [employer], gotas  de hongo no esta recomendado. Llame al especialista de oidos.    ED Prescriptions   None    PDMP not reviewed this encounter.   Vonda Guadeloupe, PA-C 01/09/24 1751

## 2024-01-09 NOTE — ED Triage Notes (Signed)
 Pt reports had left ear pain and blockage for 20 days. Had medications for it for still persisting.

## 2024-01-09 NOTE — Discharge Instructions (Signed)
 Diluya vinagre blanco con agua destilada 50% de cada uno and enjuague su oido todos los dias Porque le vieron on [employer] en to [employer], gotas  de hongo no esta recomendado. Llame al especialista de oidos.

## 2024-01-11 ENCOUNTER — Ambulatory Visit: Admitting: Family

## 2024-01-12 LAB — AEROBIC CULTURE W GRAM STAIN (SUPERFICIAL SPECIMEN): Culture: NO GROWTH

## 2024-01-13 ENCOUNTER — Ambulatory Visit (HOSPITAL_COMMUNITY): Payer: Self-pay

## 2024-01-16 MED ORDER — FLUCONAZOLE 200 MG PO TABS: 200.0000 mg | ORAL_TABLET | Freq: Every day | ORAL | 0 refills | Status: AC

## 2024-01-16 NOTE — Telephone Encounter (Signed)
 Called patient number on file and she hung up stating she doesn't know who/why I was calling.

## 2024-01-16 NOTE — Telephone Encounter (Signed)
 She has a fungal ear infection and they called in an oral antifungal pill for her to take. She needs to check with the pharmacy. also she still needs to keep be sure she has called the ENT specialist to schedule an appointment. Thanks.

## 2024-01-16 NOTE — Progress Notes (Signed)
 You may inform pt that candida is growing but this is not final. In the mean time please call in Diflucan  200 mg every day x 3 days, no refills, and needs to continue the white vinegar rinses like I taught her until she sees ENT.

## 2024-01-18 ENCOUNTER — Encounter (INDEPENDENT_AMBULATORY_CARE_PROVIDER_SITE_OTHER): Payer: Self-pay

## 2024-01-22 ENCOUNTER — Other Ambulatory Visit: Payer: Self-pay | Admitting: Nurse Practitioner

## 2024-01-22 DIAGNOSIS — H66012 Acute suppurative otitis media with spontaneous rupture of ear drum, left ear: Secondary | ICD-10-CM

## 2024-01-30 LAB — CULTURE, FUNGUS WITHOUT SMEAR

## 2024-01-30 MED ORDER — FLUOCINOLONE ACETONIDE 0.01 % OT OIL: 5.0000 [drp] | TOPICAL_OIL | Freq: Two times a day (BID) | OTIC | 0 refills | Status: DC | PRN

## 2024-01-30 NOTE — Telephone Encounter (Signed)
 Prescription for Derm otic oil sent to pharmacy for treatment of patient's complaint of sensations of pressure and fullness.  Due to lack of physical exam findings corroborating superficial fungal infection, finding on antifungal culture is of no clinical significance and does not require antifungal treatment.

## 2024-02-02 ENCOUNTER — Telehealth: Payer: Self-pay | Admitting: Family

## 2024-02-02 ENCOUNTER — Encounter (INDEPENDENT_AMBULATORY_CARE_PROVIDER_SITE_OTHER): Payer: Self-pay | Admitting: Otolaryngology

## 2024-02-02 ENCOUNTER — Telehealth: Payer: Self-pay | Admitting: Pulmonary Disease

## 2024-02-02 NOTE — Telephone Encounter (Signed)
 [place] calling back Sherry Hunt on a CT scan. He sees the one from 2023 but not a recent one. Consuelo took call.

## 2024-02-02 NOTE — Telephone Encounter (Signed)
 Copied from CRM [id number]. Topic: Clinical - Lab/Test Results >> Feb 02, 2024  9:35 AM [place] A wrote: Reason for CRM: Corliss with [facility] is requesting patient's CT Scan Fax number is [phone]

## 2024-02-02 NOTE — Telephone Encounter (Signed)
 Spoke with Sherry Hunt regarding prior message .Sherry Hunt stated the only imaging they have was from 2023 no new imaging . As we can see in the patient's chart.  Nothing  else further needed.

## 2024-02-02 NOTE — Telephone Encounter (Signed)
 Left message letting her Corliss know the only CT I see is from 2023 if she wants that, or if someone else sent her to get a CT done to reach out to them.

## 2024-02-07 ENCOUNTER — Ambulatory Visit (INDEPENDENT_AMBULATORY_CARE_PROVIDER_SITE_OTHER): Admitting: Acute Care

## 2024-02-07 ENCOUNTER — Encounter: Payer: Self-pay | Admitting: Acute Care

## 2024-02-07 VITALS — BP 138/94 | HR 91 | Ht 62.0 in | Wt 152.6 lb

## 2024-02-07 DIAGNOSIS — F17200 Nicotine dependence, unspecified, uncomplicated: Secondary | ICD-10-CM

## 2024-02-07 DIAGNOSIS — R911 Solitary pulmonary nodule: Secondary | ICD-10-CM

## 2024-02-07 NOTE — Progress Notes (Signed)
 See above for note.

## 2024-02-07 NOTE — Progress Notes (Signed)
 History of Present Illness Sherry Hunt is a 48 y.o. female current everyday smoker with referred for evaluation of pulmonary nodule found on abdominal CT to evaluate diverticulitis.  She was referred July 2025 and will be followed by Dr. Shelah.   02/07/2024 Pt presents for evaluation of lung nodule found on abdominal CT to evaluate diverticulitis in 2023. She is here with an interpreter. She had another scan in 2024, which showed the lower lobe nodule had increased in size from 3 mm - 4 mm.   We have reviewed her CT scans.  Both were CTs of the abdomen pelvis.  Patient has not had a dedicated CT of the chest.  I discussed that it would be beneficial to get the upper lobes of her lungs in addition to the lower lobes we see on the CT abdomen and pelvis to evaluate fully for any pulmonary nodules.  As it has been 6 months since her last scan plan will be to repeat dedicated CT of the chest now, and reevaluate once we have reviewed the results.  Patient is in agreement with this plan.  She is asymptomatic.  However I have asked her to call to be seen sooner if she develops any unexplained weight loss or hemoptysis.  She verbalized understanding.  She is a current every smoker. She has been smoking x 17 years. She smokes at least a pack per day. She has not had weight loss that is unintentional. She denies any hemoptysis. She does not have a cough. She has a history of asthma and Covid, and does occasionally use Albuterol  for shortness of breath as needed.She has an ear infection that has been slow to resolve. She is currently under the care of an ear nose throat physician to address this.  No family history of lung cancer. Family history of uterine, colon cancer and pancreatic cancer in her mother.Paternal grandmother died of breast cancer.  Family history also includes allergies, and asthma.  Patient lives with her boyfriend.  She is a Physiological scientist by occupation.  She does have  children.    She has an allergy to iodine , she has taken prednisone  in the past the last time about 3 months ago.  She endorses indigestion, tooth and dental problems, nasal congestion, anxiety, and depression.  We discussed smoking cessation.  She would like to try to quit smoking.  She states that her boyfriend also smokes and it makes it difficult for her to quit.  When I asked her if she thought if he would quit with her she stated she did not think so.   We have discussed several different resources to assist in smoking cessation.  These include nicotine replacement therapy, hypnosis, and acupuncture.  She understands she needs to work on quitting smoking but she finds it very difficult.  Test Results:  07/24/2023 CT Abdomen and Pelvis ( [facility]) Extensive colonic diverticulosis with findings of early acute uncomplicated sigmoid diverticulitis. 2. Moderately distended gallbladder with suggestion of diffuse wall thickening and trace pericholecystic fluid. No hyperdense gallstone. If there is clinical concern for acute cholecystitis, further evaluation with right upper quadrant ultrasound is recommended. 3. Fibroid uterus. 4. Incidentally noted 3 mm noncalcified nodule within the left lower lobe. No follow-up needed if patient is low-risk.   01/05/2022 CT abdomen and pelvis Extensive colonic diverticulosis with findings of early acute uncomplicated sigmoid diverticulitis. 2. Moderately distended gallbladder with suggestion of diffuse wall thickening and trace pericholecystic fluid. No hyperdense gallstone. If there  is clinical concern for acute cholecystitis, further evaluation with right upper quadrant ultrasound is recommended. 3. Fibroid uterus. 4. Incidentally noted 3 mm noncalcified nodule within the left lower lobe. No follow-up needed if patient is low-risk.     Latest Ref Rng & Units 03/18/2023    5:48 PM 01/05/2022    6:00 AM 01/07/2021   12:42 AM  CBC  WBC 4.0 - 10.5  K/uL 10.0  10.1  10.1   Hemoglobin 12.0 - 15.0 g/dL 86.4  86.5  87.8   Hematocrit 36.0 - 46.0 % 40.2  40.2  35.4   Platelets 150 - 400 K/uL 249  258  322        Latest Ref Rng & Units 04/06/2023    9:55 AM 03/18/2023    5:48 PM 01/05/2022    6:00 AM  BMP  Glucose 70 - 99 mg/dL 86  865  891   BUN 6 - 23 mg/dL 10  12  16    Creatinine 0.40 - 1.20 mg/dL 9.08  9.12  8.80   Sodium 135 - 145 mEq/L 137  135  136   Potassium 3.5 - 5.1 mEq/L 4.7  3.8  4.1   Chloride 96 - 112 mEq/L 105  104  103   CO2 19 - 32 mEq/L 29  25  23    Calcium 8.4 - 10.5 mg/dL 89.4  89.6  89.1     BNP No results found for: BNP  ProBNP No results found for: PROBNP  PFT No results found for: FEV1PRE, FEV1POST, FVCPRE, FVCPOST, TLC, DLCOUNC, PREFEV1FVCRT, PSTFEV1FVCRT  No results found.   Past medical hx Past Medical History:  Diagnosis Date   Asthma    Depression    diverticulitis    Endometrial polyp    AND INTRAUTERINE LESIONS   GERD (gastroesophageal reflux disease)    Menorrhagia      Social History   Tobacco Use   Smoking status: Every Day    Current packs/day: 0.00    Average packs/day: 0.5 packs/day for 10.0 years (5.0 ttl pk-yrs)    Types: Cigarettes    Start date: 12/31/2008    Last attempt to quit: 01/01/2019    Years since quitting: 5.1   Smokeless tobacco: Never   Tobacco comments:    1/2 pack a day 02/07/2024 KRD  Vaping Use   Vaping status: Never Used  Substance Use Topics   Alcohol use: Yes    Comment: OCCASIONAL   Drug use: No    Ms.Kareem Aul reports that she has been smoking cigarettes. She started smoking about 15 years ago. She has a 5 pack-year smoking history. She has never used smokeless tobacco. She reports current alcohol use. She reports that she does not use drugs.  Tobacco Cessation:  Current everyday smoker with a 17-pack-year smoking history  Past surgical hx, Family hx, Social hx all reviewed.  Current Outpatient Medications on  File Prior to Visit  Medication Sig   albuterol  (VENTOLIN  HFA) 108 (90 Base) MCG/ACT inhaler Inhale 2 puffs into the lungs every 6 (six) hours as needed.   famotidine  (PEPCID ) 40 MG tablet TAKE 1 TABLET BY MOUTH TWICE A DAY   Fluocinolone  Acetonide (DERMOTIC ) 0.01 % OIL Place 5 drops in ear(s) 2 (two) times daily as needed (Itching in ears).   ibuprofen  (ADVIL ) 600 MG tablet Take 1 tablet (600 mg total) by mouth every 8 (eight) hours as needed for moderate pain (pain score 4-6).   ipratropium (ATROVENT ) 0.03 % nasal spray  Place 2 sprays into both nostrils every 12 (twelve) hours.   No current facility-administered medications on file prior to visit.     Allergies  Allergen Reactions   Iodine Other (See Comments)    iodine on skin causes irritation Other reaction(s): rash    Review Of Systems:  Constitutional:   No  weight loss, night sweats,  Fevers, chills, fatigue, or  lassitude.  HEENT:   No headaches,  Difficulty swallowing,  Tooth/dental problems, or  Sore throat,                No sneezing, itching, ear ache, nasal congestion, post nasal drip,   CV:  No chest pain,  Orthopnea, PND, swelling in lower extremities, anasarca, dizziness, palpitations, syncope.   GI  No heartburn, indigestion, abdominal pain, nausea, vomiting, diarrhea, change in bowel habits, loss of appetite, bloody stools.   Resp: No shortness of breath with exertion or at rest.  No excess mucus, no productive cough,  No non-productive cough,  No coughing up of blood.  No change in color of mucus.  No wheezing.  No chest wall deformity  Skin: no rash or lesions.  GU: no dysuria, change in color of urine, no urgency or frequency.  No flank pain, no hematuria   MS:  No joint pain or swelling.  No decreased range of motion.  No back pain.  Psych:  No change in mood or affect. No depression or anxiety.  No memory loss.   Vital Signs BP (!) 138/94 (BP Location: Left Arm, Patient Position: Sitting, Cuff Size:  Normal)   Pulse 91   Ht 5' 2 (1.575 m)   Wt 152 lb 9.6 oz (69.2 kg)   LMP  (LMP Unknown)   SpO2 98%   BMI 27.91 kg/m    Physical Exam:  General- No distress,  A&Ox3, pleasant ENT: No sinus tenderness, TM clear, pale nasal mucosa, no oral exudate,no post nasal drip, no LAN Cardiac: S1, S2, regular rate and rhythm, no murmur Chest: No wheeze/ rales/ dullness; no accessory muscle use, no nasal flaring, no sternal retractions Abd.: Soft Non-tender, nondistended, bowel sounds positive,Body mass index is 27.91 kg/m.  Ext: No clubbing cyanosis, edema, no obvious deformities Neuro:  normal strength, moving all extremities x 4, alert and oriented x 3, appropriate Skin: No rashes, warm and dry, no obvious skin lesions Psych: normal mood and behavior   Assessment/Plan Incidental pulmonary nodule noted on abdominal CT evaluating diverticulitis Current everyday smoker with a 17-pack-year smoking history Plan It is good to see you today. We will do a CT Chest to further evaluate the lung nodule seen on your abdominal CT's of the abdomen. You will follow up with me 1 week  after the scan to review results. Please work on quitting smoking. You can receive free nicotine replacement therapy (patches, gum, or mints) by calling 1-800-QUIT NOW. Please call so we can get you on the path to becoming a non-smoker. I know it is hard, but you can do this!  Hypnosis for smoking cessation  [employer] [phone]  Acupuncture for smoking cessation  [facility] [phone]    Call if you need us  before the follow up appointment . Please contact office for sooner follow up if symptoms do not improve or worsen or seek emergency care    I spent 30 minutes dedicated to the care of this patient on the date of this encounter to include pre-visit review of records, face-to-face time with  the patient discussing conditions above, post visit ordering of testing, clinical  documentation with the electronic health record, making appropriate referrals as documented, and communicating necessary information to the patient's healthcare team.    Lauraine JULIANNA Lites, NP 02/07/2024  11:13 AM

## 2024-02-07 NOTE — Patient Instructions (Signed)
 It is good to see you today. We will do a CT Chest to further evaluate the lung nodule seen on your abdominal CT's of the abdomen. You will follow up with me 1 week  after the scan to review results. Please work on quitting smoking. You can receive free nicotine replacement therapy (patches, gum, or mints) by calling 1-800-QUIT NOW. Please call so we can get you on the path to becoming a non-smoker. I know it is hard, but you can do this!  Hypnosis for smoking cessation  [employer] [phone]  Acupuncture for smoking cessation  [facility] [phone]    Call if you need us  before the follow up appointment . Please contact office for sooner follow up if symptoms do not improve or worsen or seek emergency care

## 2024-02-10 ENCOUNTER — Ambulatory Visit: Admitting: Acute Care

## 2024-02-14 ENCOUNTER — Ambulatory Visit (INDEPENDENT_AMBULATORY_CARE_PROVIDER_SITE_OTHER): Admitting: Obstetrics and Gynecology

## 2024-02-14 ENCOUNTER — Encounter: Payer: Self-pay | Admitting: Obstetrics and Gynecology

## 2024-02-14 VITALS — BP 116/74 | HR 105 | Ht 62.75 in | Wt 150.0 lb

## 2024-02-14 DIAGNOSIS — Z01419 Encounter for gynecological examination (general) (routine) without abnormal findings: Secondary | ICD-10-CM | POA: Diagnosis not present

## 2024-02-14 DIAGNOSIS — E2839 Other primary ovarian failure: Secondary | ICD-10-CM | POA: Diagnosis not present

## 2024-02-14 DIAGNOSIS — Z1231 Encounter for screening mammogram for malignant neoplasm of breast: Secondary | ICD-10-CM

## 2024-02-14 DIAGNOSIS — Z1331 Encounter for screening for depression: Secondary | ICD-10-CM

## 2024-02-14 DIAGNOSIS — N951 Menopausal and female climacteric states: Secondary | ICD-10-CM | POA: Diagnosis not present

## 2024-02-14 DIAGNOSIS — F17200 Nicotine dependence, unspecified, uncomplicated: Secondary | ICD-10-CM

## 2024-02-14 DIAGNOSIS — N941 Unspecified dyspareunia: Secondary | ICD-10-CM | POA: Diagnosis not present

## 2024-02-14 DIAGNOSIS — N898 Other specified noninflammatory disorders of vagina: Secondary | ICD-10-CM

## 2024-02-14 DIAGNOSIS — D229 Melanocytic nevi, unspecified: Secondary | ICD-10-CM

## 2024-02-14 MED ORDER — INTRAROSA 6.5 MG VA INST
1.0000 | VAGINAL_INSERT | Freq: Every evening | VAGINAL | 12 refills | Status: DC | PRN
Start: 2024-02-14 — End: 2024-04-16

## 2024-02-14 MED ORDER — FLUCONAZOLE 150 MG PO TABS: 150.0000 mg | ORAL_TABLET | Freq: Once | ORAL | 0 refills | Status: AC

## 2024-02-14 MED ORDER — VEOZAH 45 MG PO TABS: 1.0000 | ORAL_TABLET | Freq: Every day | ORAL | 0 refills | Status: DC

## 2024-02-14 MED ORDER — BUPROPION HCL ER (XL) 150 MG PO TB24: 150.0000 mg | ORAL_TABLET | Freq: Every day | ORAL | 3 refills | Status: DC

## 2024-02-14 MED ORDER — PROGESTERONE MICRONIZED 100 MG PO CAPS: 100.0000 mg | ORAL_CAPSULE | Freq: Every day | ORAL | 3 refills | Status: DC

## 2024-02-14 NOTE — Patient Instructions (Signed)
 [place]: this was sent to [employer].  They will call you in 24 hours and review insurance and send to you.  Max usually is about 80$ a month.  This will a vaginal suppository that is plant based to help with the pain with intercourse and vaginal dryness.

## 2024-02-14 NOTE — Progress Notes (Signed)
 48 y.o. y.o. female here for annual exam. No LMP recorded (lmp unknown). Patient is postmenopausal.    G2P2L2 Married   RP:  Established patient presenting for annual gyn exam    HPI: Stopped Junel Fe 1.5/30 noted on last annual.  No VB since. No HRT. Having very bothersome hot flushes. No pelvic pain.  No pain with intercourse.  Pap Neg 6/24.  No h/o abnormal Pap.  Reports dyspareunia and low libido, depression, irritability, insomnia. Breasts normal. H/O bilateral breast reduction.  Mammo 7/24.    Urine and bowel movements normal. Body mass index 26.96. Needs to increase physical activities. Good nutrition. Daily smoker. Husband smokes.  Going to try nicotine patch Dxa: none. No fractures. To get baseline Colo 8/23 diverticulum and one polyp  HRT: to begin wellbutrin  to help with smoking cessation, veozah  for now, promtrium at bedtime.  To add estrogen with smoking cessation  Body mass index is 26.78 kg/m.     02/14/2024    9:33 AM 04/06/2023    9:09 AM  Depression screen PHQ 2/9  Decreased Interest 0 1  Down, Depressed, Hopeless 0 1  PHQ - 2 Score 0 2  Altered sleeping  1  Tired, decreased energy  1  Change in appetite  0  Feeling bad or failure about yourself   2  Trouble concentrating  2  Moving slowly or fidgety/restless  2  Suicidal thoughts  0  PHQ-9 Score  10  Difficult doing work/chores  Somewhat difficult    Blood pressure 116/74, pulse (!) 105, height 5' 2.75 (1.594 m), weight 150 lb (68 kg), SpO2 98%.     Component Value Date/Time   DIAGPAP  01/20/2023 1203    - Negative for intraepithelial lesion or malignancy (NILM)   ADEQPAP  01/20/2023 1203    Satisfactory for evaluation; transformation zone component PRESENT.    GYN HISTORY:    Component Value Date/Time   DIAGPAP  01/20/2023 1203    - Negative for intraepithelial lesion or malignancy (NILM)   ADEQPAP  01/20/2023 1203    Satisfactory for evaluation; transformation zone component PRESENT.     OB History  Gravida Para Term Preterm AB Living  2 2    2   SAB IAB Ectopic Multiple Live Births          # Outcome Date GA Lbr Len/2nd Weight Sex Type Anes PTL Lv  2 Para           1 Para             Past Medical History:  Diagnosis Date   Asthma    Depression    diverticulitis    Endometrial polyp    AND INTRAUTERINE LESIONS   GERD (gastroesophageal reflux disease)    Lung nodule    awaiting CT scan   Menorrhagia     Past Surgical History:  Procedure Laterality Date   DILATATION & CURETTAGE/HYSTEROSCOPY WITH MYOSURE N/A 02/11/2017   Procedure: DILATATION & CURETTAGE/HYSTEROSCOPY WITH MYOSURE;  Surgeon: Lavoie, Marie-Lyne, MD;  Location: [facility];  Service: Gynecology;  Laterality: N/A;  request 1:00pm OR time  request one hour   Liposunction     REDUCTION MAMMAPLASTY Bilateral 2009   and Liposuction    Current Outpatient Medications on File Prior to Visit  Medication Sig Dispense Refill   albuterol  (VENTOLIN  HFA) 108 (90 Base) MCG/ACT inhaler Inhale 2 puffs into the lungs every 6 (six) hours as needed.     famotidine  (PEPCID )  40 MG tablet TAKE 1 TABLET BY MOUTH TWICE A DAY 180 tablet 0   OVER THE COUNTER MEDICATION [place], for menopause     Fluocinolone  Acetonide (DERMOTIC ) 0.01 % OIL Place 5 drops in ear(s) 2 (two) times daily as needed (Itching in ears). (Patient not taking: Reported on 02/14/2024) 20 mL 0   ibuprofen  (ADVIL ) 600 MG tablet Take 1 tablet (600 mg total) by mouth every 8 (eight) hours as needed for moderate pain (pain score 4-6). (Patient not taking: Reported on 02/14/2024) 60 tablet 0   ipratropium (ATROVENT ) 0.03 % nasal spray Place 2 sprays into both nostrils every 12 (twelve) hours. (Patient not taking: Reported on 02/14/2024) 30 mL 0   No current facility-administered medications on file prior to visit.    Social History   Socioeconomic History   Marital status: Single    Spouse name: Not on file   Number of children: 2    Years of education: Not on file   Highest education level: Bachelor's degree (e.g., BA, AB, BS)  Occupational History   Not on file  Tobacco Use   Smoking status: Every Day    Current packs/day: 1.00    Average packs/day: 1 pack/day for 17.1 years (17.1 ttl pk-yrs)    Types: Cigarettes    Start date: 01/01/2007   Smokeless tobacco: Never   Tobacco comments:    Patient states she smokes 1 pack/day and has been smoking for 17 years.  Vaping Use   Vaping status: Never Used  Substance and Sexual Activity   Alcohol use: Yes    Comment: OCCASIONAL, 1 x per month   Drug use: No   Sexual activity: Yes    Partners: Male    Birth control/protection: Post-menopausal  Other Topics Concern   Not on file  Social History Narrative   Not on file   Social Drivers of Health   Financial Resource Strain: High Risk (01/02/2024)   Overall Financial Resource Strain (CARDIA)    Difficulty of Paying Living Expenses: Hard  Food Insecurity: Low Risk  (01/18/2024)   Received from [facility]   Hunger Vital Sign    Within the past 12 months, you worried that your food would run out before you got money to buy more: Never true    Within the past 12 months, the food you bought just didn't last and you didn't have money to get more. : Never true  Recent Concern: Food Insecurity - Food Insecurity Present (01/02/2024)   Hunger Vital Sign    Worried About Running Out of Food in the Last Year: Sometimes true    Ran Out of Food in the Last Year: Never true  Transportation Needs: No Transportation Needs (01/18/2024)   Received from [facility]    In the past 12 months, has lack of reliable transportation kept you from medical appointments, meetings, work or from getting things needed for daily living? : No  Physical Activity: Unknown (01/02/2024)   Exercise Vital Sign    Days of Exercise per Week: 0 days    Minutes of Exercise per Session: Not on file  Stress: No Stress Concern Present  (01/02/2024)   [employer] of Occupational Health - Occupational Stress Questionnaire    Feeling of Stress : Only a little  Social Connections: Moderately Isolated (01/02/2024)   Social Connection and Isolation Panel    Frequency of Communication with Friends and Family: More than three times a week    Frequency of Social  Gatherings with Friends and Family: Once a week    Attends Religious Services: Never    Database administrator or Organizations: No    Attends Engineer, structural: Not on file    Marital Status: Living with partner  Intimate Partner Violence: Not on file    Family History  Problem Relation Age of Onset   Diabetes Mother    Hypertension Mother    Cancer Mother        endometrial   Pancreatic cancer Mother    Stroke Father    Hypertension Father    Hyperthyroidism Sister    Asthma Sister    Cancer Maternal Aunt        COLON   Colon cancer Maternal Aunt      Allergies  Allergen Reactions   Iodine Other (See Comments)    iodine on skin causes irritation Other reaction(s): rash      Patient's last menstrual period was No LMP recorded (lmp unknown). Patient is postmenopausal..            Review of Systems Alls systems reviewed and are negative.     Physical Exam Constitutional:      Appearance: Normal appearance.  Genitourinary:     Vulva and urethral meatus normal.     No lesions in the vagina.     Right Labia: No rash, lesions or skin changes.    Left Labia: No lesions, skin changes or rash.    No vaginal discharge or tenderness.     No vaginal prolapse present.    No vaginal atrophy present.     Right Adnexa: not tender, not palpable and no mass present.    Left Adnexa: not tender, not palpable and no mass present.    No cervical motion tenderness or discharge.     Uterus is not enlarged, tender or irregular.  Breasts:    Right: Normal.     Left: Normal.  HENT:     Head: Normocephalic.  Neck:     Thyroid : No thyroid   mass, thyromegaly or thyroid  tenderness.  Cardiovascular:     Rate and Rhythm: Normal rate and regular rhythm.     Heart sounds: Normal heart sounds, S1 normal and S2 normal.  Pulmonary:     Effort: Pulmonary effort is normal.     Breath sounds: Normal breath sounds and air entry.  Chest:     Comments: B/l breast reduction Multiple nevus Abdominal:     General: There is no distension.     Palpations: Abdomen is soft. There is no mass.     Tenderness: There is no abdominal tenderness. There is no guarding or rebound.  Musculoskeletal:        General: Normal range of motion.     Cervical back: Full passive range of motion without pain, normal range of motion and neck supple. No tenderness.     Right lower leg: No edema.     Left lower leg: No edema.  Neurological:     Mental Status: She is alert.  Skin:    General: Skin is warm.  Psychiatric:        Mood and Affect: Mood normal.        Behavior: Behavior normal.        Thought Content: Thought content normal.  Vitals and nursing note reviewed. Exam conducted with a chaperone present.       A:         Well Woman GYN exam, vasomotor  symptoms, depression, insomnia, ADH symptoms secondary to menopause, desires HRT, smoker                             P:        Pap smear collected today Encouraged annual mammogram screening Colon cancer screening up-to-date DXA ordered today Labs and immunizations: see orders. CMP today and repeat in 3 months Discussed breast self exams Encouraged healthy lifestyle practices Encouraged Vit D and Calcium   HRT: to begin wellbutrin  in AM to help with smoking cessation and depression, veozah  for now, promtrium at bedtime.  To add estrogen with smoking cessation. F/u in 3 months with labs or sooner with any concerns.  Counseled on r/b/a/I of HRT use.  May need to add in Vyvanse or adderall No follow-ups on file.  Almarie MARLA Carpen

## 2024-02-15 ENCOUNTER — Ambulatory Visit: Payer: Self-pay | Admitting: Obstetrics and Gynecology

## 2024-02-15 LAB — HM MAMMOGRAPHY

## 2024-02-15 LAB — SURESWAB® ADVANCED VAGINITIS PLUS,TMA
C. trachomatis RNA, TMA: NOT DETECTED
CANDIDA SPECIES: NOT DETECTED
Candida glabrata: NOT DETECTED
N. gonorrhoeae RNA, TMA: NOT DETECTED
SURESWAB(R) ADV BACTERIAL VAGINOSIS(BV),TMA: POSITIVE — AB
TRICHOMONAS VAGINALIS (TV),TMA: NOT DETECTED

## 2024-02-15 MED ORDER — VEOZAH 45 MG PO TABS: 1.0000 | ORAL_TABLET | Freq: Every day | ORAL | 0 refills | Status: DC

## 2024-02-15 NOTE — Progress Notes (Signed)
 Message to [place], [employer] to confirm if labs can be added.  Thyroid  US  ordered.  Lab unable to add additional labs.

## 2024-02-16 MED ORDER — METRONIDAZOLE 500 MG PO TABS: 500.0000 mg | ORAL_TABLET | Freq: Two times a day (BID) | ORAL | 0 refills | Status: AC

## 2024-02-16 NOTE — Telephone Encounter (Signed)
 Dr. Glennon -can you confirm if prometrium  Rx needed to go to [facility]?

## 2024-02-16 NOTE — Addendum Note (Signed)
 Addended by: BRUTUS KATE SAILOR on: 02/16/2024 07:30 AM   Modules accepted: Orders

## 2024-02-16 NOTE — Addendum Note (Signed)
 Addended by: BRUTUS KATE SAILOR on: 02/16/2024 10:13 AM   Modules accepted: Orders

## 2024-02-18 LAB — TESTOS,TOTAL,FREE AND SHBG (FEMALE)
Free Testosterone: 1.1 pg/mL (ref 0.1–6.4)
Sex Hormone Binding: 63 nmol/L (ref 17–124)
Testosterone, Total, LC-MS-MS: 14 ng/dL (ref 2–45)

## 2024-02-18 LAB — COMPREHENSIVE METABOLIC PANEL WITH GFR
AG Ratio: 1.6 (calc) (ref 1.0–2.5)
ALT: 18 U/L (ref 6–29)
AST: 17 U/L (ref 10–35)
Albumin: 4.6 g/dL (ref 3.6–5.1)
Alkaline phosphatase (APISO): 66 U/L (ref 31–125)
BUN: 12 mg/dL (ref 7–25)
CO2: 26 mmol/L (ref 20–32)
Calcium: 10.9 mg/dL — ABNORMAL HIGH (ref 8.6–10.2)
Chloride: 104 mmol/L (ref 98–110)
Creat: 0.83 mg/dL (ref 0.50–0.99)
Globulin: 2.9 g/dL (ref 1.9–3.7)
Glucose, Bld: 86 mg/dL (ref 65–99)
Potassium: 4.4 mmol/L (ref 3.5–5.3)
Sodium: 138 mmol/L (ref 135–146)
Total Bilirubin: 0.4 mg/dL (ref 0.2–1.2)
Total Protein: 7.5 g/dL (ref 6.1–8.1)
eGFR: 86 mL/min/1.73m2 (ref >=60)

## 2024-02-18 LAB — LIPID PANEL
Cholesterol: 231 mg/dL — ABNORMAL HIGH (ref <200)
HDL: 54 mg/dL (ref >=50)
LDL Cholesterol (Calc): 143 mg/dL — ABNORMAL HIGH
Non-HDL Cholesterol (Calc): 177 mg/dL — ABNORMAL HIGH (ref <130)
Total CHOL/HDL Ratio: 4.3 (calc) (ref <5.0)
Triglycerides: 200 mg/dL — ABNORMAL HIGH (ref <150)

## 2024-02-18 LAB — FOLLICLE STIMULATING HORMONE: FSH: 128.6 m[IU]/mL — ABNORMAL HIGH

## 2024-02-18 LAB — TSH: TSH: 1.33 m[IU]/L

## 2024-02-18 LAB — ESTRADIOL: Estradiol: 46 pg/mL

## 2024-02-20 ENCOUNTER — Encounter: Payer: Self-pay | Admitting: Obstetrics and Gynecology

## 2024-02-20 ENCOUNTER — Ambulatory Visit: Payer: Self-pay | Admitting: Obstetrics and Gynecology

## 2024-02-20 ENCOUNTER — Other Ambulatory Visit

## 2024-02-21 ENCOUNTER — Other Ambulatory Visit

## 2024-02-21 LAB — PTH, INTACT AND CALCIUM
Calcium: 10.1 mg/dL (ref 8.6–10.2)
PTH: 54 pg/mL (ref 16–77)

## 2024-02-22 ENCOUNTER — Other Ambulatory Visit: Payer: Self-pay

## 2024-02-22 ENCOUNTER — Ambulatory Visit: Payer: Self-pay | Admitting: Obstetrics and Gynecology

## 2024-02-22 MED ORDER — VEOZAH 45 MG PO TABS: 1.0000 | ORAL_TABLET | Freq: Every day | ORAL | 0 refills | Status: DC

## 2024-02-22 NOTE — Telephone Encounter (Signed)
 Medication [facility] to [facility]. Patient notified of pharmacy change.

## 2024-03-03 ENCOUNTER — Ambulatory Visit (HOSPITAL_BASED_OUTPATIENT_CLINIC_OR_DEPARTMENT_OTHER)
Admission: RE | Admit: 2024-03-03 | Discharge: 2024-03-03 | Disposition: A | Discharge status: Home / Self Care | Source: Ambulatory Visit | Attending: Acute Care | Admitting: Acute Care

## 2024-03-03 DIAGNOSIS — R911 Solitary pulmonary nodule: Secondary | ICD-10-CM | POA: Diagnosis present

## 2024-03-14 ENCOUNTER — Encounter: Payer: Self-pay | Admitting: Acute Care

## 2024-03-14 ENCOUNTER — Ambulatory Visit (INDEPENDENT_AMBULATORY_CARE_PROVIDER_SITE_OTHER): Admitting: Acute Care

## 2024-03-14 ENCOUNTER — Encounter: Payer: Self-pay | Admitting: Family

## 2024-03-14 VITALS — BP 125/83 | HR 90 | Temp 97.9°F | Ht 62.0 in | Wt 153.6 lb

## 2024-03-14 DIAGNOSIS — F17210 Nicotine dependence, cigarettes, uncomplicated: Secondary | ICD-10-CM | POA: Diagnosis not present

## 2024-03-14 DIAGNOSIS — R918 Other nonspecific abnormal finding of lung field: Secondary | ICD-10-CM

## 2024-03-14 DIAGNOSIS — R9389 Abnormal findings on diagnostic imaging of other specified body structures: Secondary | ICD-10-CM | POA: Diagnosis not present

## 2024-03-14 DIAGNOSIS — K8020 Calculus of gallbladder without cholecystitis without obstruction: Secondary | ICD-10-CM

## 2024-03-14 DIAGNOSIS — R21 Rash and other nonspecific skin eruption: Secondary | ICD-10-CM

## 2024-03-14 DIAGNOSIS — R911 Solitary pulmonary nodule: Secondary | ICD-10-CM | POA: Diagnosis not present

## 2024-03-14 DIAGNOSIS — K5792 Diverticulitis of intestine, part unspecified, without perforation or abscess without bleeding: Secondary | ICD-10-CM

## 2024-03-14 DIAGNOSIS — Z720 Tobacco use: Secondary | ICD-10-CM

## 2024-03-14 NOTE — Progress Notes (Signed)
 History of Present Illness Sherry Hunt is a 48 y.o. female current everyday smoker with referred for evaluation of pulmonary nodule found on abdominal CT to evaluate diverticulitis.  She was referred July 2025 and will be followed by Dr. Shelah.    03/14/2024 Discussed the use of AI scribe software for clinical note transcription with the patient, who gave verbal consent to proceed.  History of Present Illness Sherry Hunt is a 48 year old female who presents for follow-up of stable pulmonary nodules.  A month ago, an abdominal CT incidentally revealed a lung nodule, leading to a follow-up dedicated CT chest on March 03, 2024 to evaluate all lung fields.. The chest CT showed small pulmonary nodules bilaterally, some of which are calcified, consistent with prior granulomatous disease. Those previously demonstrated at the lung bases are stable. She has no shortness of breath or other respiratory symptoms. I am suspicious that this may be sarcoidosis.  Her family history is significant for multiple cancers, including uterine, pancreatic, colon, and breast cancer. Her mother passed away from cancer. There is no family history of autoimmune diseases such as lupus or rheumatoid arthritis.   She denies vision problems and has not had regular eye exams. She denies rashes that come and go but mentions eczema and has been referred to a dermatologist for evaluation of skin discoloration and raised rashes on her hands and temple area.She also has some raised areas in her right ear canal.  She has an upcoming dermatology appointment on April 24, 2024. If these are biopsied by dermatology, we can assess for likelihood of sarcoidosis. I did message dermatology.   She smokes a pack of cigarettes per day and has attempted to quit using medication but only managed for three days.   She reports having a gallstone discovered during a CT scan but is not currently experiencing  significant pain. She has not scheduled a follow-up with a GI physician. I have encouraged her to schedule an appointment for both her diverticular disease and the finding of a gall stone on CT.  We spent 3 minutes discussing smoking cessation. I have provided her with options for smoking cessation and resources at our 02/07/2024 visit.      Test Results: CT Chest 03/03/2024 Mediastinum/Nodes: There are no enlarged mediastinal, hilar or axillary lymph nodes.There are small calcified left hilar lymph nodes. The thyroid  gland, trachea and esophagus demonstrate no significant findings.   Lungs/Pleura: No pleural effusion or pneumothorax. Small left lower lobe subpleural nodule of concern on previous CT is unchanged, measuring 4 mm on image 95/3. There are additional small nodules, including a stable subpleural nodule in the left lower lobe on image 92/3. There is a densely calcified granuloma in the lingula on image 84/3. Less densely partially calcified left upper lobe nodule measuring 4 mm on image 63/3. Subpleural nodular density over the right hemidiaphragm measuring 6 x 8 mm on image 94/3, unchanged from previous abdominal CT. No new, enlarging or morphologically suspicious nodules.   Upper abdomen: No acute findings are seen within the visualized upper abdomen. There is a gallstone.   Musculoskeletal/Chest wall: There is no chest wall mass or suspicious osseous finding.     Small pulmonary nodules bilaterally, some of which are calcified, consistent with prior granulomatous disease. Those previously demonstrated at the lung bases are stable. No acute chest findings. 3. Cholelithiasis.     Latest Ref Rng & Units 03/18/2023    5:48 PM 01/05/2022    6:00 AM  01/07/2021   12:42 AM  CBC  WBC 4.0 - 10.5 K/uL 10.0  10.1  10.1   Hemoglobin 12.0 - 15.0 g/dL 86.4  86.5  87.8   Hematocrit 36.0 - 46.0 % 40.2  40.2  35.4   Platelets 150 - 400 K/uL 249  258  322        Latest Ref Rng &  Units 02/20/2024    9:55 AM 02/14/2024   10:37 AM 04/06/2023    9:55 AM  BMP  Glucose 65 - 99 mg/dL  86  86   BUN 7 - 25 mg/dL  12  10   Creatinine 9.49 - 0.99 mg/dL  9.16  9.08   BUN/Creat Ratio 6 - 22 (calc)  SEE NOTE:    Sodium 135 - 146 mmol/L  138  137   Potassium 3.5 - 5.3 mmol/L  4.4  4.7   Chloride 98 - 110 mmol/L  104  105   CO2 20 - 32 mmol/L  26  29   Calcium 8.6 - 10.2 mg/dL 89.8  89.0  89.4     BNP No results found for: BNP  ProBNP No results found for: PROBNP  PFT No results found for: FEV1PRE, FEV1POST, FVCPRE, FVCPOST, TLC, DLCOUNC, PREFEV1FVCRT, PSTFEV1FVCRT  CT CHEST WO CONTRAST Result Date: 03/13/2024 CLINICAL DATA:  Follow up pulmonary nodule. EXAM: CT CHEST WITHOUT CONTRAST TECHNIQUE: Multidetector CT imaging of the chest was performed following the standard protocol without IV contrast. RADIATION DOSE REDUCTION: This exam was performed according to the departmental dose-optimization program which includes automated exposure control, adjustment of the mA and/or kV according to patient size and/or use of iterative reconstruction technique. COMPARISON:  No prior chest CT available. Correlation made with chest radiographs 02/06/2019 and abdominal CT 01/05/2022. FINDINGS: Cardiovascular: No significant vascular findings on noncontrast imaging. The heart size is normal. There is no pericardial effusion. Mediastinum/Nodes: There are no enlarged mediastinal, hilar or axillary lymph nodes.There are small calcified left hilar lymph nodes. The thyroid  gland, trachea and esophagus demonstrate no significant findings. Lungs/Pleura: No pleural effusion or pneumothorax. Small left lower lobe subpleural nodule of concern on previous CT is unchanged, measuring 4 mm on image 95/3. There are additional small nodules, including a stable subpleural nodule in the left lower lobe on image 92/3. There is a densely calcified granuloma in the lingula on image 84/3. Less densely  partially calcified left upper lobe nodule measuring 4 mm on image 63/3. Subpleural nodular density over the right hemidiaphragm measuring 6 x 8 mm on image 94/3, unchanged from previous abdominal CT. No new, enlarging or morphologically suspicious nodules. Upper abdomen: No acute findings are seen within the visualized upper abdomen. There is a gallstone. Musculoskeletal/Chest wall: There is no chest wall mass or suspicious osseous finding. IMPRESSION: 1. Small pulmonary nodules bilaterally, some of which are calcified, consistent with prior granulomatous disease. Those previously demonstrated at the lung bases are stable. Per Fleischner guidelines, no specific follow-up imaging recommended. This recommendation follows the consensus statement: Guidelines for Management of Small Pulmonary Nodules Detected on CT Images: From the Fleischner Society 2017; Radiology 2017; 284:228-243. 2. No acute chest findings. 3. Cholelithiasis. Electronically Signed   By: Elsie Perone M.D.   On: 03/13/2024 16:22     Past medical hx Past Medical History:  Diagnosis Date   Asthma    Depression    diverticulitis    Endometrial polyp    AND INTRAUTERINE LESIONS   GERD (gastroesophageal reflux disease)    Lung  nodule    awaiting CT scan   Menorrhagia    Smoker      Social History   Tobacco Use   Smoking status: Every Day    Current packs/day: 1.00    Average packs/day: 1 pack/day for 17.2 years (17.2 ttl pk-yrs)    Types: Cigarettes    Start date: 01/01/2007   Smokeless tobacco: Never   Tobacco comments:    Patient states she smokes 1 pack/day and has been smoking for 17 years.  Vaping Use   Vaping status: Never Used  Substance Use Topics   Alcohol use: Yes    Comment: OCCASIONAL, 1 x per month   Drug use: No    Sherry Hunt reports that she has been smoking cigarettes. She started smoking about 17 years ago. She has a 17.2 pack-year smoking history. She has never used smokeless tobacco.  She reports current alcohol use. She reports that she does not use drugs.  Tobacco Cessation: Ready to quit: Not Answered Counseling given: Not Answered Tobacco comments: Patient states she smokes 1 pack/day and has been smoking for 17 years. Current every day smoker , I spent 3-4 minutes counseling patient on  steps to stop use of tobacco products. I have provided patient with information on receiving free nicotine replacement therapy, and contact numbers for hypnosis for smoking cessation as well as acupuncture for smoking cessation.   Past surgical hx, Family hx, Social hx all reviewed.  Current Outpatient Medications on File Prior to Visit  Medication Sig   albuterol  (VENTOLIN  HFA) 108 (90 Base) MCG/ACT inhaler Inhale 2 puffs into the lungs every 6 (six) hours as needed.   ibuprofen  (ADVIL ) 600 MG tablet Take 1 tablet (600 mg total) by mouth every 8 (eight) hours as needed for moderate pain (pain score 4-6).   ipratropium (ATROVENT ) 0.03 % nasal spray Place 2 sprays into both nostrils every 12 (twelve) hours.   OVER THE COUNTER MEDICATION Morenga, for menopause   buPROPion  (WELLBUTRIN  XL) 150 MG 24 hr tablet Take 1 tablet (150 mg total) by mouth daily. Take in AM (Patient not taking: Reported on 03/14/2024)   famotidine  (PEPCID ) 40 MG tablet TAKE 1 TABLET BY MOUTH TWICE A DAY (Patient not taking: Reported on 03/14/2024)   Fluocinolone  Acetonide (DERMOTIC ) 0.01 % OIL Place 5 drops in ear(s) 2 (two) times daily as needed (Itching in ears). (Patient not taking: Reported on 03/14/2024)   Prasterone  (INTRAROSA ) 6.5 MG INST Place 1 suppository vaginally at bedtime as needed. (Patient not taking: Reported on 03/14/2024)   progesterone  (PROMETRIUM ) 100 MG capsule Take 1 capsule (100 mg total) by mouth at bedtime. Take at bedtime to help with sleep and hot flashes (Patient not taking: Reported on 03/14/2024)   No current facility-administered medications on file prior to visit.     Allergies   Allergen Reactions   Iodine Other (See Comments)    iodine on skin causes irritation Other reaction(s): rash    Review Of Systems:  Constitutional:   No  weight loss, night sweats,  Fevers, chills, fatigue, or  lassitude.  HEENT:   No headaches,  Difficulty swallowing,  Tooth/dental problems, or  Sore throat,                No sneezing, itching, ear ache, nasal congestion, post nasal drip,   CV:  No chest pain,  Orthopnea, PND, swelling in lower extremities, anasarca, dizziness, palpitations, syncope.   GI  No heartburn, indigestion, abdominal pain, nausea, vomiting, diarrhea, change  in bowel habits, loss of appetite, bloody stools.   Resp: No shortness of breath with exertion or at rest.  No excess mucus, no productive cough,  No non-productive cough,  No coughing up of blood.  No change in color of mucus.  No wheezing.  No chest wall deformity  Skin: raised , discolored areas to the face, hands, raised areas to her right auditory canal.   GU: no dysuria, change in color of urine, no urgency or frequency.  No flank pain, no hematuria   MS:  No joint pain or swelling.  No decreased range of motion.  No back pain.  Psych:  No change in mood or affect. No depression or anxiety.  No memory loss.   Vital Signs BP 125/83   Pulse 90   Temp 97.9 F (36.6 C)   Ht 5' 2 (1.575 m)   Wt 153 lb 9.6 oz (69.7 kg)   LMP  (LMP Unknown)   SpO2 99%   BMI 28.09 kg/m    Physical Exam:  General- No distress,  A&Ox3, pleasant ENT: No sinus tenderness, TM clear, pale nasal mucosa, no oral exudate,no post nasal drip, no LAN Cardiac: S1, S2, regular rate and rhythm, no murmur Chest: No wheeze/ rales/ dullness; no accessory muscle use, no nasal flaring, no sternal retractions Abd.: Soft Non-tender, ND, BS +, Body mass index is 28.09 kg/m.  Ext: No clubbing cyanosis, edema, no obvious deformities Neuro:  normal strength, MAE x 4, A&O x 3, apprioriate Skin: No rashes, warm and dry, skin  lesions as noted above Psych: normal mood and behavior   Assessment/Plan  Assessment and Plan Assessment & Plan Stable pulmonary nodules likely secondary to granulomatous disease, possible sarcoidosis Nodules are stable with no new or enlarging changes. Differential diagnosis includes sarcoidosis. - Order ACE level to evaluate for sarcoidosis. - Schedule follow-up chest CT in 6 months due to smoking history. - Coordinate with dermatology to evaluate skin lesions for possible sarcoidosis.  Skin rash with raised lesions, pending dermatology evaluation for possible sarcoidosis Raised skin lesions on hands and temple area. Possible sarcoidosis if biopsy confirms granulomatous disease. - Send message to dermatologist to share biopsy results if suspicious for sarcoidosis. - Advise her to inform dermatologist about lung nodules and possible sarcoidosis.  Asymptomatic gallstone (cholelithiasis) Gallstone identified on CT scan. No significant pain reported. Follow up with GI  only if significant pain develops. - Advise her to schedule appointment with GI doctor to review gallstone findings.   Nicotine dependence, current daily smoker Current smoker, approximately one pack per day. Previous attempt to quit with medication was unsuccessful. - Encourage smoking cessation. - Provide resources for smoking cessation support, including hypnosis if feasible.   Your scan shows several lung nodules that are stable in size . We will do a 6 month follow up scan to keep an eye on these.  This will be due February 2026. You will get a call to schedule this closer to the time it is due. You will follow up with me 1-2 weeks after the scan to review the results.  I have sent a message to dermatology regarding sharing biopsy results with me if they biopsy the areas on your hand and face. ACE level today. We will call you with results. Call if you need us  sooner.  Please contact office for sooner follow  up if symptoms do not improve or worsen or seek emergency care    I spent 30 minutes dedicated to the care  of this patient on the date of this encounter to include pre-visit review of records, face-to-face time with the patient discussing conditions above, post visit ordering of testing, clinical documentation with the electronic health record, making appropriate referrals as documented, and communicating necessary information to the patient's healthcare team.    Lauraine JULIANNA Lites, NP 03/14/2024  10:53 AM

## 2024-03-14 NOTE — Patient Instructions (Addendum)
 It is good to see you today. Your scan shows several lung nodules that are stable in size . We will do a 6 month follow up scan to keep an eye on these.  This will be due February 2026. You will get a call to schedule this closer to the time it is due. You will follow up with me 1-2 weeks after the scan to review the results.  I have sent a message to dermatology regarding sharing biopsy results with me if they biopsy the areas on your hand and face. ACE level today. We will call you with results. Call if you need us  sooner.  Please contact office for sooner follow up if symptoms do not improve or worsen or seek emergency care

## 2024-03-16 LAB — ANGIOTENSIN CONVERTING ENZYME: Angiotensin-Converting Enzyme: 18 U/L (ref 9–67)

## 2024-03-16 NOTE — Telephone Encounter (Signed)
 ok to send referral to [facility] for Diverticulosis and gallstones. Thx

## 2024-03-16 NOTE — Telephone Encounter (Signed)
 Referral placed.

## 2024-03-22 ENCOUNTER — Other Ambulatory Visit: Payer: Self-pay

## 2024-03-22 DIAGNOSIS — K5790 Diverticulosis of intestine, part unspecified, without perforation or abscess without bleeding: Secondary | ICD-10-CM

## 2024-04-10 ENCOUNTER — Encounter (HOSPITAL_COMMUNITY): Payer: Self-pay | Admitting: *Deleted

## 2024-04-10 ENCOUNTER — Encounter: Payer: Self-pay | Admitting: Family

## 2024-04-10 ENCOUNTER — Ambulatory Visit (HOSPITAL_COMMUNITY)
Admission: EM | Admit: 2024-04-10 | Discharge: 2024-04-10 | Disposition: A | Discharge status: Home / Self Care | Attending: Emergency Medicine | Admitting: Emergency Medicine

## 2024-04-10 DIAGNOSIS — H9202 Otalgia, left ear: Secondary | ICD-10-CM

## 2024-04-10 DIAGNOSIS — R42 Dizziness and giddiness: Secondary | ICD-10-CM

## 2024-04-10 DIAGNOSIS — H9212 Otorrhea, left ear: Secondary | ICD-10-CM | POA: Diagnosis not present

## 2024-04-10 DIAGNOSIS — H938X2 Other specified disorders of left ear: Secondary | ICD-10-CM

## 2024-04-10 DIAGNOSIS — H60392 Other infective otitis externa, left ear: Secondary | ICD-10-CM

## 2024-04-10 NOTE — Discharge Instructions (Addendum)
 As discussed I do not believe would be beneficial to add any additional liquid to the ear including Debrox as this is likely causing this drainage to build up and therefore causing a clogging sensation to your ear. I recommend avoiding applying any liquid treatments to your ear and giving the ear some time to clear out the drainage that is already there. Follow-up with your ENT for further evaluation if your symptoms continue.

## 2024-04-10 NOTE — ED Triage Notes (Signed)
 Pt states she has left ear fullness she seen PCP about issue and is waiting on a referral to ENT.   She states she has had a fungal infection in her ears in the past

## 2024-04-10 NOTE — ED Provider Notes (Signed)
 [facility]    CSN: [id number] Arrival date & time: 04/10/24  1014      History   Chief Complaint Chief Complaint  Patient presents with   Ear Fullness    HPI Coila Camera Krienke is a 48 y.o. female.   Patient presents with left ear fullness that began this morning.  Patient states that she did see an ENT about a month and a half ago was told that she had a fungal infection in her ear and was given treatment for this at that time.  Patient states that she has been applying alcohol and vinegar to her right ear over the last few weeks to help with ongoing issues.  Has fever, body aches, chills, cough, and congestion.  The history is provided by the patient and medical records.  Ear Fullness    Past Medical History:  Diagnosis Date   Asthma    Depression    diverticulitis    Endometrial polyp    AND INTRAUTERINE LESIONS   GERD (gastroesophageal reflux disease)    Lung nodule    awaiting CT scan   Menorrhagia    Smoker     Patient Active Problem List   Diagnosis Date Noted   Postmenopausal disorder 01/03/2024   Lung nodule 01/03/2024   Varicose veins of left lower extremity with pain 01/03/2024   Diverticulosis 04/06/2023   Nicotine dependence 04/06/2023    Past Surgical History:  Procedure Laterality Date   DILATATION & CURETTAGE/HYSTEROSCOPY WITH MYOSURE N/A 02/11/2017   Procedure: DILATATION & CURETTAGE/HYSTEROSCOPY WITH MYOSURE;  Surgeon: Lavoie, Marie-Lyne, MD;  Location: [facility];  Service: Gynecology;  Laterality: N/A;  request 1:00pm OR time  request one hour   Liposunction     REDUCTION MAMMAPLASTY Bilateral 2009   and Liposuction    OB History     Gravida  2   Para  2   Term      Preterm      AB      Living  2      SAB      IAB      Ectopic      Multiple      Live Births               Home Medications    Prior to Admission medications   Medication Sig Start Date End Date Taking?  Authorizing Provider  famotidine  (PEPCID ) 40 MG tablet TAKE 1 TABLET BY MOUTH TWICE A DAY 06/21/23  Yes Hudnell, Corean, NP  albuterol  (VENTOLIN  HFA) 108 (90 Base) MCG/ACT inhaler Inhale 2 puffs into the lungs every 6 (six) hours as needed. 07/24/23   [provider]  buPROPion  (WELLBUTRIN  XL) 150 MG 24 hr tablet Take 1 tablet (150 mg total) by mouth daily. Take in AM Patient not taking: No sig reported 02/14/24   Glennon Almarie POUR, MD  Fluocinolone  Acetonide (DERMOTIC ) 0.01 % OIL Place 5 drops in ear(s) 2 (two) times daily as needed (Itching in ears). Patient not taking: No sig reported 01/30/24   Joesph Shaver Scales, PA-C  ibuprofen  (ADVIL ) 600 MG tablet Take 1 tablet (600 mg total) by mouth every 8 (eight) hours as needed for moderate pain (pain score 4-6). 12/31/23   Fleming, Zelda W, NP  ipratropium (ATROVENT ) 0.03 % nasal spray Place 2 sprays into both nostrils every 12 (twelve) hours. 12/31/23   Theotis Haze ORN, NP  OVER THE COUNTER MEDICATION Arla, for menopause    [provider]  Prasterone  (INTRAROSA ) 6.5 MG INST Place 1 suppository vaginally at bedtime as needed. Patient not taking: No sig reported 02/14/24   Glennon Almarie POUR, MD  progesterone  (PROMETRIUM ) 100 MG capsule Take 1 capsule (100 mg total) by mouth at bedtime. Take at bedtime to help with sleep and hot flashes Patient not taking: No sig reported 02/14/24   Glennon Almarie POUR, MD    Family History Family History  Problem Relation Age of Onset   Diabetes Mother    Hypertension Mother    Cancer Mother        endometrial   Pancreatic cancer Mother    Stroke Father    Hypertension Father    Hyperthyroidism Sister    Asthma Sister    Cancer Maternal Aunt        COLON   Colon cancer Maternal Aunt     Social History Social History   Tobacco Use   Smoking status: Every Day    Current packs/day: 1.00    Average packs/day: 1 pack/day for 17.3 years (17.3 ttl pk-yrs)    Types:  Cigarettes    Start date: 01/01/2007   Smokeless tobacco: Never   Tobacco comments:    Patient states she smokes 1 pack/day and has been smoking for 17 years.  Vaping Use   Vaping status: Never Used  Substance Use Topics   Alcohol use: Yes    Comment: OCCASIONAL, 1 x per month   Drug use: No     Allergies   Iodine   Review of Systems Review of Systems  Per HPI  Physical Exam Triage Vital Signs ED Triage Vitals  Encounter Vitals Group     BP 04/10/24 1207 119/84     Girls Systolic BP Percentile --      Girls Diastolic BP Percentile --      Boys Systolic BP Percentile --      Boys Diastolic BP Percentile --      Pulse Rate 04/10/24 1207 80     Resp 04/10/24 1207 16     Temp 04/10/24 1207 97.9 F (36.6 C)     Temp Source 04/10/24 1207 Oral     SpO2 04/10/24 1207 96 %     Weight --      Height --      Head Circumference --      Peak Flow --      Pain Score 04/10/24 1206 0     Pain Loc --      Pain Education --      Exclude from Growth Chart --    No data found.  Updated Vital Signs BP 119/84 (BP Location: Left Arm)   Pulse 80   Temp 97.9 F (36.6 C) (Oral)   Resp 16   LMP  (LMP Unknown)   SpO2 96%   Visual Acuity Right Eye Distance:   Left Eye Distance:   Bilateral Distance:    Right Eye Near:   Left Eye Near:    Bilateral Near:     Physical Exam Vitals and nursing note reviewed.  Constitutional:      General: She is awake. She is not in acute distress.    Appearance: Normal appearance. She is well-developed and well-groomed. She is not ill-appearing.  HENT:     Left Ear: Drainage present.     Ears:     Comments: Significant amount of drainage noted to left ear canal that is blocking TM.   Neurological:  Mental Status: She is alert.  Psychiatric:        Behavior: Behavior is cooperative.      UC Treatments / Results  Labs (all labs ordered are listed, but only abnormal results are displayed) Labs Reviewed - No data to  display  EKG   Radiology No results found.  Procedures Procedures (including critical care time)  Medications Ordered in UC Medications - No data to display  Initial Impression / Assessment and Plan / UC Course  I have reviewed the triage vital signs and the nursing notes.  Pertinent labs & imaging results that were available during my care of the patient were reviewed by me and considered in my medical decision making (see chart for details).     Patient is overall well-appearing.  Vitals are stable. Staff attempted ear lavage without success due to patient reporting burning sensation with this.  Upon reassessment ear appears the same with significant amount of drainage noted to the ear.  Recommended patient avoid applying anything to the ear and giving it some time to drain out on its own.  Recommended following up with ENT for further evaluation if symptoms continue.  Discussed follow-up and return precautions. Final Clinical Impressions(s) / UC Diagnoses   Final diagnoses:  Otorrhea of left ear  Sensation of plugged ear on left side     Discharge Instructions      As discussed I do not believe would be beneficial to add any additional liquid to the ear including Debrox as this is likely causing this drainage to build up and therefore causing a clogging sensation to your ear. I recommend avoiding applying any liquid treatments to your ear and giving the ear some time to clear out the drainage that is already there. Follow-up with your ENT for further evaluation if your symptoms continue.   ED Prescriptions   None    PDMP not reviewed this encounter.   Johnie Flaming A, NP 04/10/24 1312

## 2024-04-16 ENCOUNTER — Encounter: Payer: Self-pay | Admitting: Gastroenterology

## 2024-04-16 ENCOUNTER — Ambulatory Visit (INDEPENDENT_AMBULATORY_CARE_PROVIDER_SITE_OTHER): Admitting: Gastroenterology

## 2024-04-16 VITALS — BP 120/82 | HR 76 | Ht 62.0 in | Wt 157.6 lb

## 2024-04-16 DIAGNOSIS — K8020 Calculus of gallbladder without cholecystitis without obstruction: Secondary | ICD-10-CM

## 2024-04-16 DIAGNOSIS — K219 Gastro-esophageal reflux disease without esophagitis: Secondary | ICD-10-CM

## 2024-04-16 MED ORDER — PANTOPRAZOLE SODIUM 40 MG PO TBEC: 40.0000 mg | DELAYED_RELEASE_TABLET | Freq: Every day | ORAL | 3 refills | Status: DC

## 2024-04-16 NOTE — Patient Instructions (Addendum)
 Recommend GERD Start pantoprazole  40 mg po daily  Gaviscon prn Email me in few weeks and let me know how you are doing NO NSAIDS (ibuprofen , naproxen, and aspirin)  diverticulosis Recommend high fiber diet   Gallstone Avoid fatty greasy foods  Please follow up sooner if symptoms increase or worsen  Due to recent changes in healthcare laws, you may see the results of your imaging and laboratory studies on MyChart before your provider has had a chance to review them.  We understand that in some cases there may be results that are confusing or concerning to you. Not all laboratory results come back in the same time frame and the provider may be waiting for multiple results in order to interpret others.  Please give us  48 hours in order for your provider to thoroughly review all the results before contacting the office for clarification of your results.   Thank you for trusting me with your gastrointestinal care!   Deanna May, NP _______________________________________________________  If your blood pressure at your visit was 140/90 or greater, please contact your primary care physician to follow up on this.  _______________________________________________________  If you are age 70 or older, your body mass index should be between 23-30. Your Body mass index is 28.83 kg/m. If this is out of the aforementioned range listed, please consider follow up with your Primary Care Provider.  If you are age 63 or younger, your body mass index should be between 19-25. Your Body mass index is 28.83 kg/m. If this is out of the aformentioned range listed, please consider follow up with your Primary Care Provider.   ________________________________________________________  The [facility] providers would like to encourage you to use MYCHART to communicate with providers for non-urgent requests or questions.  Due to long hold times on the telephone, sending your provider a message by [facility] may be a  faster and more efficient way to get a response.  Please allow 48 business hours for a response.  Please remember that this is for non-urgent requests.  _______________________________________________________

## 2024-04-16 NOTE — Progress Notes (Signed)
 Chief Complaint: gallstones, diverticulitis Primary GI Doctor:Dr. Charlanne  HPI:  Patient is a  48  year old female patient with past medical history of history of diverticulitis, GERD, depression, who was referred to me by Lucius Krabbe, NP on 03/16/24 for a evaluation of gallstones, diverticulitis.  .   Patient last seen in the GI office on 01/29/2022 by Delon, PA for history of diverticulitis and epigastric pain.  *Due to language barrier, an interpreter was present during the history-taking and subsequent discussion (and for part of the physical exam) with this patient.    Interval History   Patient presents to discuss history of gastritis and diverticulitis along with CT scan recently done of chest that showed gallstone. Patient reports longstanding history of gastritis which she explains as epigastric pain.  She reports she has been in the urgent care several times for this complaint.  Patient currently taking over-the-counter Gaviscon as needed.  She tells me she has been on omeprazole  in the past but did not feel it helped.  Patient denies any nausea or vomiting.  Patient denies dysphagia.  Patient does state the pain sometimes can radiate to her back.  She denies NSAID use.  She does occasionally drink tequila.   She also has history of diverticulitis she tells me she has had 3 episodes since 2023 that were separated by about a year.  Patient reports she is currently having a regular bowel movement daily.  Patient denies left lower quadrant pain or blood in stool.  Patient reports she follows a very strict diet to try to manage her symptoms.  Wt Readings from Last 3 Encounters:  04/16/24 157 lb 9.6 oz (71.5 kg)  03/14/24 153 lb 9.6 oz (69.7 kg)  02/14/24 150 lb (68 kg)    Past Medical History:  Diagnosis Date   Asthma    Depression    diverticulitis    Endometrial polyp    AND INTRAUTERINE LESIONS   Gallstones    GERD (gastroesophageal reflux disease)    Lung nodule     awaiting CT scan   Menorrhagia    Smoker     Past Surgical History:  Procedure Laterality Date   DILATATION & CURETTAGE/HYSTEROSCOPY WITH MYOSURE N/A 02/11/2017   Procedure: DILATATION & CURETTAGE/HYSTEROSCOPY WITH MYOSURE;  Surgeon: Lavoie, Marie-Lyne, MD;  Location: [facility];  Service: Gynecology;  Laterality: N/A;  request 1:00pm OR time  request one hour   Liposunction     REDUCTION MAMMAPLASTY Bilateral 2009   and Liposuction    Current Outpatient Medications  Medication Sig Dispense Refill   albuterol  (VENTOLIN  HFA) 108 (90 Base) MCG/ACT inhaler Inhale 2 puffs into the lungs every 6 (six) hours as needed.     pantoprazole  (PROTONIX ) 40 MG tablet Take 1 tablet (40 mg total) by mouth daily. 90 tablet 3   No current facility-administered medications for this visit.    Allergies as of 04/16/2024 - Review Complete 04/16/2024  Allergen Reaction Noted   Iodine Other (See Comments) 12/23/2016    Family History  Problem Relation Age of Onset   Diabetes Mother    Hypertension Mother    Cancer Mother        endometrial   Pancreatic cancer Mother    Stroke Father    Hypertension Father    Hyperthyroidism Sister    Asthma Sister    Cancer Maternal Aunt        COLON   Colon cancer Maternal Aunt     Review  of Systems:    Constitutional: No weight loss, fever, chills, weakness or fatigue HEENT: Eyes: No change in vision               Ears, Nose, Throat:  No change in hearing or congestion Skin: No rash or itching Cardiovascular: No chest pain, chest pressure or palpitations   Respiratory: No SOB or cough Gastrointestinal: See HPI and otherwise negative Genitourinary: No dysuria or change in urinary frequency Neurological: No headache, dizziness or syncope Musculoskeletal: No new muscle or joint pain Hematologic: No bleeding or bruising Psychiatric: No history of depression or anxiety    Physical Exam:  Vital signs: BP 120/82   Pulse 76   Ht 5' 2  (1.575 m)   Wt 157 lb 9.6 oz (71.5 kg)   LMP  (LMP Unknown)   BMI 28.83 kg/m   Constitutional:   Pleasant  female appears to be in NAD, Well developed, Well nourished, alert and cooperative vity and pharynx without inflammation, swelling or lesion.  Respiratory: Respirations even and unlabored. Lungs clear to auscultation bilaterally.   No wheezes, crackles, or rhonchi.  Cardiovascular: Normal S1, S2. Regular rate and rhythm. No peripheral edema, cyanosis or pallor.  Gastrointestinal:  Soft, nondistended, nontender. No rebound or guarding. Hypoactive bowel sounds. No appreciable masses or hepatomegaly. Msk:  Symmetrical without gross deformities. Without edema, no deformity or joint abnormality.  Neurologic:  Alert and  oriented x4;  grossly normal neurologically.  Skin:   Dry and intact without significant lesions or rashes.  RELEVANT LABS AND IMAGING: CBC    Latest Ref Rng & Units 03/18/2023    5:48 PM 01/05/2022    6:00 AM 01/07/2021   12:42 AM  CBC  WBC 4.0 - 10.5 K/uL 10.0  10.1  10.1   Hemoglobin 12.0 - 15.0 g/dL 86.4  86.5  87.8   Hematocrit 36.0 - 46.0 % 40.2  40.2  35.4   Platelets 150 - 400 K/uL 249  258  322      CMP     Latest Ref Rng & Units 02/20/2024    9:55 AM 02/14/2024   10:37 AM 04/06/2023    9:55 AM  CMP  Glucose 65 - 99 mg/dL  86  86   BUN 7 - 25 mg/dL  12  10   Creatinine 9.49 - 0.99 mg/dL  9.16  9.08   Sodium 864 - 146 mmol/L  138  137   Potassium 3.5 - 5.3 mmol/L  4.4  4.7   Chloride 98 - 110 mmol/L  104  105   CO2 20 - 32 mmol/L  26  29   Calcium 8.6 - 10.2 mg/dL 89.8  89.0  89.4   Total Protein 6.1 - 8.1 g/dL  7.5  7.3   Total Bilirubin 0.2 - 1.2 mg/dL  0.4  0.4   Alkaline Phos 39 - 117 U/L   58   AST 10 - 35 U/L  17  13   ALT 6 - 29 U/L  18  10      Lab Results  Component Value Date   TSH 1.33 02/14/2024  GI procedures 03/22/2022 colonoscopy/EGD Colonoscopy, recall 10 years  - One 2 mm polyp in the rectum, removed with a cold biopsy forceps.  Resected and retrieved. - Moderate pancolonic diverticulosis - Non- bleeding internal hemorrhoids. - The examined portion of the ileum was normal.The examination was otherwise normal on direct and retroflexion views.  EGD - Small hiatal hernia. - Gastritis. Biopsied. -  Normal examined duodenum. Biopsied. Path: 1. Surgical [P], small bowel - SMALL INTESTINAL MUCOSA WITH NO SPECIFIC HISTOPATHOLOGIC CHANGES - NEGATIVE FOR INCREASED INTRAEPITHELIAL LYMPHOCYTES OR VILLOUS ARCHITECTURAL CHANGES 2. Surgical [P], gastric antrum and gastric body - GASTRIC ANTRAL MUCOSA WITH MILD NONSPECIFIC REACTIVE GASTROPATHY - HELICOBACTER PYLORI-LIKE ORGANISMS ARE NOT IDENTIFIED ON ROUTINE H&E STAIN 3. Surgical [P], distal esophagus and proximal - ESOPHAGEAL SQUAMOUS MUCOSA WITH NO SPECIFIC HISTOPATHOLOGIC CHANGES - NEGATIVE FOR INCREASED INTRAEPITHELIAL EOSINOPHILS 4. Surgical [P], colon, rectum, polyp (1) - HYPERPLASTIC POLYP   03/03/2024 CT chest IMPRESSION: 1. Small pulmonary nodules bilaterally, some of which are calcified, consistent with prior granulomatous disease. Those previously demonstrated at the lung bases are stable. Per Fleischner guidelines, no specific follow-up imaging recommended. This recommendation follows the consensus statement: Guidelines for Management of Small Pulmonary Nodules Detected on CT Images: From the Fleischner Society 2017; Radiology 2017; 284:228-243. 2. No acute chest findings. 3. Cholelithiasis.   Assessment: Encounter Diagnoses  Name Primary?   Gastroesophageal reflux disease, unspecified whether esophagitis present Yes   Calculus of gallbladder without cholecystitis without obstruction     48 year old female patient that presents with intermittent epigastric pain.  Patient has tried omeprazole  in the past with no improvement we will go ahead and start patient on pantoprazole  40 mg p.o. daily along with strict GERD diet.  We also discussed general surgeon referral  for gallstone however patient would like to wait for now.  Patient reports she does not have right upper quadrant pain and/or nausea. EGD 03/2022 with neg biopsies.    Patient also has history of diverticulosis with diverticulitis however denies left lower quadrant pain and/or altered bowel habits.  Reinforced high-fiber diet.  ER precautions given.    Patient up-to-date on colonoscopy done in August 2023 with 10-year recall.   Plan: - Start pantoprazole  40 mg po daily  -Recommend GERD diet, no late meals  - Recommend high fiber diet  -referral to general surgery, declined   Thank you for the courtesy of this consult. Please call me with any questions or concerns.   Rykin Route, FNP-C [facility] 04/16/2024, 4:00 PM  Cc: Lucius Krabbe, NP

## 2024-04-23 ENCOUNTER — Encounter: Payer: Self-pay | Admitting: Family

## 2024-04-23 ENCOUNTER — Ambulatory Visit (INDEPENDENT_AMBULATORY_CARE_PROVIDER_SITE_OTHER): Admitting: Family

## 2024-04-23 VITALS — BP 114/77 | HR 67 | Temp 98.1°F | Ht 62.0 in | Wt 158.4 lb

## 2024-04-23 DIAGNOSIS — H6123 Impacted cerumen, bilateral: Secondary | ICD-10-CM

## 2024-04-23 DIAGNOSIS — R42 Dizziness and giddiness: Secondary | ICD-10-CM

## 2024-04-23 DIAGNOSIS — H938X2 Other specified disorders of left ear: Secondary | ICD-10-CM

## 2024-04-23 DIAGNOSIS — M5442 Lumbago with sciatica, left side: Secondary | ICD-10-CM | POA: Insufficient documentation

## 2024-04-23 DIAGNOSIS — K219 Gastro-esophageal reflux disease without esophagitis: Secondary | ICD-10-CM | POA: Insufficient documentation

## 2024-04-23 DIAGNOSIS — H938X3 Other specified disorders of ear, bilateral: Secondary | ICD-10-CM

## 2024-04-23 MED ORDER — MECLIZINE HCL 12.5 MG PO TABS: 6.2500 mg | ORAL_TABLET | Freq: Three times a day (TID) | ORAL | 1 refills | Status: DC | PRN

## 2024-04-23 MED ORDER — TRIAMCINOLONE ACETONIDE 0.1 % EX CREA: 1.0000 | TOPICAL_CREAM | Freq: Two times a day (BID) | CUTANEOUS | 0 refills | Status: AC

## 2024-04-23 MED ORDER — TRIAMCINOLONE ACETONIDE 0.1 % EX CREA: 1.0000 | TOPICAL_CREAM | Freq: Two times a day (BID) | CUTANEOUS | 0 refills | Status: DC

## 2024-04-23 NOTE — Progress Notes (Signed)
 Patient ID: Sherry Hunt, female    DOB: Jun 29, 1976, 48 y.o.   MRN: [id number]  Chief Complaint  Patient presents with   Dizziness    Pt c/o vertigo and bilateral ear itching, present for 2 weeks.   Discussed the use of AI scribe software for clinical note transcription with the patient, who gave verbal consent to proceed.  History of Present Illness   Sherry Hunt is a 48 year old female who presents with dizziness and ear discomfort.  She has ongoing issues with her left ear, including a sensation of fullness and the room spinning, particularly with positional changes or lying down. Previous [facility] visit in July involved cleaning of the right ear and antifungal treatment. Despite using vinegar and alcohol rinses, symptoms persist. Her left ear was previously assessed as fine, but symptoms have returned.  She experiences indigestion and episodes of low blood pressure, accompanied by a sensation of everything moving. She takes pantoprazole  for indigestion. Her ears are very dry, leading to scabs and itchiness both inside and outside the ear. Dizziness is not constant when sitting but occurs with positional changes.     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 - Prescribed meclizine  12.5mg  tid prn for vertigo, advised to start with one pill at home to assess drowsiness, ok to cut in half if causes too much drowsiness. - Instructed to take meclizine  three times a day for up to a week if needed, not beyond five to six days. - Advised to schedule follow-up with ENT to evaluate inner ear disorder and inform them of her dizziness.  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 - Prescribed triamcinolone  0.1% cream for external  ear irritation, instructed to apply a small amount with a Q-tip. - Advised to apply a facial or body cream over the steroid cream for additional moisture. - Instructed to avoid using drops in ears due to fungal infection and irritation. - Advised to follow up with ENT for ongoing management of otomycosis.   Bilateral ear impaction Verbal consent received to perform bilateral ear lavage via Hydrogen peroxide/water mix solution. Curette used to remove visible cerumen. Pt tolerated well, complete evacuation of all cerumen obtained. Mild erythema on right canal noted, but no bleeding noted in either canal after procedure.   Subjective:    Outpatient Medications Prior to Visit  Medication Sig Dispense Refill   albuterol  (VENTOLIN  HFA) 108 (90 Base) MCG/ACT inhaler Inhale 2 puffs into the lungs every 6 (six) hours as needed.     pantoprazole  (PROTONIX ) 40 MG tablet Take 1 tablet (40 mg total) by mouth daily. 90 tablet 3   No facility-administered medications prior to visit.   Past Medical History:  Diagnosis Date   Asthma    Depression    diverticulitis    Endometrial polyp    AND INTRAUTERINE LESIONS   Gallstones    GERD (gastroesophageal reflux disease)    Lung nodule    awaiting CT scan   Menorrhagia    Smoker    Past Surgical History:  Procedure Laterality Date   DILATATION & CURETTAGE/HYSTEROSCOPY WITH MYOSURE N/A 02/11/2017   Procedure: DILATATION & CURETTAGE/HYSTEROSCOPY WITH MYOSURE;  Surgeon: Lavoie, Marie-Lyne, MD;  Location: [facility];  Service: Gynecology;  Laterality: N/A;  request 1:00pm OR time  request one hour   Liposunction     REDUCTION MAMMAPLASTY Bilateral 2009   and Liposuction   Allergies  Allergen Reactions   Iodine Other (See Comments)    iodine on skin causes irritation Other reaction(s): rash      Objective:    Physical Exam Vitals and nursing note reviewed.  Constitutional:      Appearance: Normal appearance.  HENT:      Right Ear: Drainage (small amount of whitish discharge after cerumen disimpaction), swelling and tenderness (w/erythema after cerumen removal) present. There is impacted cerumen. Tympanic membrane is not scarred or erythematous.     Left Ear: Swelling present. There is impacted cerumen. Tympanic membrane is not scarred or erythematous.  Cardiovascular:     Rate and Rhythm: Normal rate and regular rhythm.  Pulmonary:     Effort: Pulmonary effort is normal.     Breath sounds: Normal breath sounds.  Musculoskeletal:        General: Normal range of motion.  Skin:    General: Skin is warm and dry.  Neurological:     Mental Status: She is alert.  Psychiatric:        Mood and Affect: Mood normal.        Behavior: Behavior normal.    BP 114/77 (BP Location: Left Arm, Patient Position: Sitting, Cuff Size: Large)   Pulse 67   Temp 98.1 F (36.7 C) (Temporal)   Ht 5' 2 (1.575 m)   Wt 158 lb 6 oz (71.8 kg)   LMP  (LMP Unknown)   SpO2 98%   BMI 28.97 kg/m  Wt Readings from Last 3 Encounters:  04/23/24 158 lb 6 oz (71.8 kg)  04/16/24 157 lb 9.6 oz (71.5 kg)  03/14/24 153 lb 9.6 oz (69.7 kg)       Lucius Krabbe, NP

## 2024-04-26 ENCOUNTER — Ambulatory Visit: Admitting: Dermatology

## 2024-05-01 ENCOUNTER — Emergency Department (HOSPITAL_BASED_OUTPATIENT_CLINIC_OR_DEPARTMENT_OTHER)
Admission: EM | Admit: 2024-05-01 | Discharge: 2024-05-01 | Disposition: A | Discharge status: Home / Self Care | Attending: Emergency Medicine | Admitting: Emergency Medicine

## 2024-05-01 ENCOUNTER — Other Ambulatory Visit: Payer: Self-pay

## 2024-05-01 DIAGNOSIS — H9201 Otalgia, right ear: Secondary | ICD-10-CM | POA: Diagnosis present

## 2024-05-01 MED ORDER — KETOROLAC TROMETHAMINE 15 MG/ML IJ SOLN
15.0000 mg | Freq: Once | INTRAMUSCULAR | Status: AC
  Administered 2024-05-01: 15 mg via INTRAMUSCULAR
  Filled 2024-05-01: qty 1

## 2024-05-01 MED ORDER — ACETAMINOPHEN-CODEINE 300-30 MG PO TABS: 1.0000 | ORAL_TABLET | Freq: Four times a day (QID) | ORAL | 0 refills | Status: AC | PRN

## 2024-05-01 NOTE — Discharge Instructions (Addendum)
 We prescribed a medication to help with your pain, please take 1 tablet every 6 hours for severe pain to your right ear.  Please follow-up with ENT as soon as possible for ongoing problems with the right ear.

## 2024-05-01 NOTE — ED Triage Notes (Signed)
 Patient states right sided ear problems for 4 months but woke up today with new pain.

## 2024-05-01 NOTE — ED Provider Notes (Signed)
 [facility] Provider Note   CSN: [id number] Arrival date & time: 05/01/24  9079     Patient presents with: Otalgia   Sherry Hunt is a 48 y.o. female.   48 year old female with a past medical history of chronic right ear infection and vertigo presents to the ED with a chief complaint of right ear pain which has been ongoing for 4 months.  Patient reports she woke up this morning at the pain more severe in nature.  Reports she has a throbbing sensation to the right ear.  She did take some ibuprofen  but this did not help with the symptoms.  She reports she has been seen by ENT, now has a referral to a second ENT.  She reports she was given multiple drops in the past which is what caused a fungal infection to her right ear.  She also describes a spinning sensation of vertigo that she has had in the past.  No fever, no headache, no nausea or vomiting.  The history is provided by the patient.  Otalgia Associated symptoms: no fever and no headaches        Prior to Admission medications   Medication Sig Start Date End Date Taking? Authorizing Provider  acetaminophen -codeine (TYLENOL  #3) 300-30 MG tablet Take 1 tablet by mouth every 6 (six) hours as needed for up to 3 days for moderate pain (pain score 4-6). 05/01/24 05/04/24 Yes Burris Matherne, PA-C  albuterol  (VENTOLIN  HFA) 108 (90 Base) MCG/ACT inhaler Inhale 2 puffs into the lungs every 6 (six) hours as needed. 07/24/23   [provider]  meclizine  (ANTIVERT ) 12.5 MG tablet Take 0.5-1 tablets (6.25-12.5 mg total) by mouth 3 (three) times daily as needed for dizziness (May cause drowsiness.). 04/23/24   Lucius Krabbe, NP  pantoprazole  (PROTONIX ) 40 MG tablet Take 1 tablet (40 mg total) by mouth daily. 04/16/24   May, Deanna J, NP  triamcinolone  cream (KENALOG ) 0.1 % Apply 1 Application topically 2 (two) times daily. Apply a very small amount to both external ears for itching.  Cover with a moisturizer. 04/23/24   Lucius Krabbe, NP    Allergies: Iodine    Review of Systems  Constitutional:  Negative for fever.  HENT:  Positive for ear pain.   Neurological:  Negative for headaches.    Updated Vital Signs BP (!) 153/90   Pulse 86   Temp 98.1 F (36.7 C) (Oral)   Resp 16   LMP  (LMP Unknown)   SpO2 98%   Physical Exam Vitals reviewed.  Constitutional:      Appearance: Normal appearance.  HENT:     Head: Normocephalic and atraumatic.     Right Ear: Tenderness present. No mastoid tenderness.     Left Ear: No tenderness. No mastoid tenderness.     Ears:     Comments: Pus on the right external canal.  No pain along the parotid, no pain in the tragus.  No tenderness along the mastoid.  No drainage noted from the left ear, TM is clear, appears to have dryness along the external canal.  No pain along the tragus, no pain along the mastoid.    Mouth/Throat:     Mouth: Mucous membranes are moist.  Cardiovascular:     Rate and Rhythm: Normal rate.  Pulmonary:     Effort: Pulmonary effort is normal.  Abdominal:     General: Abdomen is flat.  Musculoskeletal:     Cervical back: Normal range of  motion and neck supple.  Skin:    General: Skin is warm and dry.  Neurological:     Mental Status: She is alert and oriented to person, place, and time.     (all labs ordered are listed, but only abnormal results are displayed) Labs Reviewed - No data to display  EKG: None  Radiology: No results found.   Procedures   Medications Ordered in the ED  ketorolac  (TORADOL ) 15 MG/ML injection 15 mg (15 mg Intramuscular Given 05/01/24 1019)                                    Medical Decision Making Risk Prescription drug management.    Patient present to the ED with a chief complaint of right ear pain, this has been ongoing for the past 4 months.  Previously followed by ENT, reports she was fighting a fungus to the right ear, was told to do vinegar  to help dry the area.  She reports today she woke up with severe pain in the internal part of her ear.  There is no pain along the tragus, no pain along the mastoid to suggest further infection.  There is some pus collection noted inside the ear.  Patient states that ENT told her that she should not have any kind of ointment or drops placed on the right ear.  We will refrain from this at this time.  I did explain to her that I can provide her with pain control. She was given a shot of Toradol  while in the emergency department, will also go home on a short course of Tylenol  3, she is aware that this medication can make her drowsy.  We discussed close follow-up with ENT.  Hemodynamically stable for discharge.  Portions of this note were generated with Scientist, clinical (histocompatibility and immunogenetics). Dictation errors may occur despite best attempts at proofreading.   Final diagnoses:  Right ear pain    ED Discharge Orders          Ordered    acetaminophen -codeine (TYLENOL  #3) 300-30 MG tablet  Every 6 hours PRN        05/01/24 1022               Viktoriya Glaspy, PA-C 05/01/24 1027    Freddi Hamilton, MD 05/02/24 [phone]

## 2024-05-23 ENCOUNTER — Ambulatory Visit: Admitting: Obstetrics and Gynecology

## 2024-05-23 ENCOUNTER — Ambulatory Visit (HOSPITAL_BASED_OUTPATIENT_CLINIC_OR_DEPARTMENT_OTHER)
Admission: RE | Admit: 2024-05-23 | Discharge: 2024-05-23 | Disposition: A | Discharge status: Home / Self Care | Source: Ambulatory Visit | Attending: Obstetrics and Gynecology | Admitting: Obstetrics and Gynecology

## 2024-05-23 DIAGNOSIS — N951 Menopausal and female climacteric states: Secondary | ICD-10-CM | POA: Diagnosis present

## 2024-05-23 DIAGNOSIS — E2839 Other primary ovarian failure: Secondary | ICD-10-CM | POA: Insufficient documentation

## 2024-05-23 DIAGNOSIS — F17200 Nicotine dependence, unspecified, uncomplicated: Secondary | ICD-10-CM | POA: Diagnosis present

## 2024-05-23 DIAGNOSIS — Z01419 Encounter for gynecological examination (general) (routine) without abnormal findings: Secondary | ICD-10-CM | POA: Insufficient documentation

## 2024-05-24 NOTE — Telephone Encounter (Signed)
 Appt is 05-25-24 to discuss dexa result

## 2024-05-25 ENCOUNTER — Encounter (INDEPENDENT_AMBULATORY_CARE_PROVIDER_SITE_OTHER): Payer: Self-pay

## 2024-05-25 ENCOUNTER — Ambulatory Visit (INDEPENDENT_AMBULATORY_CARE_PROVIDER_SITE_OTHER): Admitting: Obstetrics and Gynecology

## 2024-05-25 ENCOUNTER — Encounter: Payer: Self-pay | Admitting: Obstetrics and Gynecology

## 2024-05-25 ENCOUNTER — Other Ambulatory Visit: Payer: Self-pay

## 2024-05-25 VITALS — BP 128/90 | HR 102 | Ht 62.6 in | Wt 154.6 lb

## 2024-05-25 DIAGNOSIS — H6240 Otitis externa in other diseases classified elsewhere, unspecified ear: Secondary | ICD-10-CM

## 2024-05-25 DIAGNOSIS — R42 Dizziness and giddiness: Secondary | ICD-10-CM

## 2024-05-25 DIAGNOSIS — E2839 Other primary ovarian failure: Secondary | ICD-10-CM

## 2024-05-25 DIAGNOSIS — B369 Superficial mycosis, unspecified: Secondary | ICD-10-CM | POA: Diagnosis not present

## 2024-05-25 MED ORDER — FLUCONAZOLE 150 MG PO TABS: 150.0000 mg | ORAL_TABLET | Freq: Once | ORAL | 0 refills | Status: AC

## 2024-05-25 NOTE — Telephone Encounter (Signed)
 Dr Glennon, her GYN ordered this, she needs to send her a mychart message please, if she still doesn't hear back after a week let me know. Thx

## 2024-05-25 NOTE — Progress Notes (Signed)
   Acute Office Visit  Subjective:    Patient ID: Sherry Hunt, female    DOB: 22-Jan-1976, 48 y.o.   MRN: [id number]   HPI 48 y.o. presents today for Consult (Discuss dexa results from 05/23/24 ) .Patient with fungal ear infection x 6 months. Seen at [facility] but unresolved after several visits.  She would like to see a [facility] ENT provider. She is also getting the vertigo again as well. Dxa with borderline osteoporosis and she is a current smoker. No fractures. Patient with reflux as well.  Dxa -2.2 frax 6.4, 1.8%. She would like to be proactive and start bone support medication before a fracture and reduce the risk of going into osteoporosis.  Counseled on bisphosphonates vs. Prolia and r/b/a/I. She would like to see if she has coverage for prolia.   No LMP recorded (lmp unknown). Patient is postmenopausal.    Review of Systems     Objective:    OBGyn Exam  BP (!) 128/90   Pulse (!) 102   Ht 5' 2.6 (1.59 m)   Wt 154 lb 9.6 oz (70.1 kg)   LMP  (LMP Unknown)   SpO2 94%   BMI 27.74 kg/m  Wt Readings from Last 3 Encounters:  05/25/24 154 lb 9.6 oz (70.1 kg)  04/23/24 158 lb 6 oz (71.8 kg)  04/16/24 157 lb 9.6 oz (71.5 kg)    Assessment & Plan:  Borderline osteoporosis, smoker, GERD Counseled on options for treatment with prolia or fosamax, boniva etc. She would like to try prolia.  Encouraged weight bearing exercises, calcium and vit D and smoking cessation. She is on intrarosa  and reports this is helping and wants to stay on this. Would feel better when she stops smoking to begin estrogen patch. She agreed. Referral to [facility]NT. To begin diflucan  every other day for 5 days  30 minutes spent on reviewing records, imaging,  and one on one patient time and counseling patient and documentation Dr. Glennon Almarie MARLA Glennon

## 2024-05-28 ENCOUNTER — Telehealth: Payer: Self-pay

## 2024-05-28 NOTE — Telephone Encounter (Signed)
 Spoke with patient. Plan of care reviewed with patient. Patient was seen in office on 05/25/24. No Rx for progesterone  needed at this time, plan was discussed at OV on 10/24, patient is working on smoking cessation.   Reviewed process of Prolia and review of benefits, questions answered. Patient aware she will be contacted once this has been completed.   Routing to [employer].   Encounter closed.

## 2024-05-28 NOTE — Telephone Encounter (Signed)
-----   Message from Annabella LITTIE Stagger sent at 05/28/2024  2:04 PM EDT -----  ----- Message ----- From: Glennon Almarie POUR, MD Sent: 05/25/2024   3:00 PM EDT To: Annabella LITTIE Stagger, CPhT  That would be great.  She is borderline osteoporosis with smoking and wants to be proactive Dr. Glennon ----- Message ----- From: Stagger Annabella LITTIE, CPhT Sent: 05/25/2024   2:53 PM EDT To: Almarie POUR Glennon, MD  Prolia will require PA. I also did a test claim for [employer]ub[employer] and its non formulary. If you want to send a prescription for Prolia. I will start PA. ----- Message ----- From: Glennon Almarie POUR, MD Sent: 05/25/2024   2:46 PM EDT To: Annabella LITTIE Stagger, CPhT  Can you run prolia for her and see if there is a chance we can get it Thank you Dr. Glennon

## 2024-05-28 NOTE — Telephone Encounter (Deleted)
 Prolia  VOB initiated via [email or web address]  Next Prolia  inj DUE: NEW START

## 2024-05-28 NOTE — Telephone Encounter (Signed)
 Prolia  VOB initiated via [email or web address]  Next Prolia  inj DUE: NEW START

## 2024-05-30 ENCOUNTER — Emergency Department (HOSPITAL_BASED_OUTPATIENT_CLINIC_OR_DEPARTMENT_OTHER)
Admission: EM | Admit: 2024-05-30 | Discharge: 2024-05-30 | Disposition: A | Discharge status: Home / Self Care | Attending: Emergency Medicine | Admitting: Emergency Medicine

## 2024-05-30 ENCOUNTER — Other Ambulatory Visit: Payer: Self-pay

## 2024-05-30 ENCOUNTER — Emergency Department (HOSPITAL_BASED_OUTPATIENT_CLINIC_OR_DEPARTMENT_OTHER)

## 2024-05-30 ENCOUNTER — Other Ambulatory Visit (HOSPITAL_COMMUNITY): Payer: Self-pay

## 2024-05-30 ENCOUNTER — Other Ambulatory Visit: Payer: Self-pay | Admitting: Obstetrics and Gynecology

## 2024-05-30 ENCOUNTER — Ambulatory Visit: Payer: Self-pay

## 2024-05-30 ENCOUNTER — Encounter (HOSPITAL_BASED_OUTPATIENT_CLINIC_OR_DEPARTMENT_OTHER): Payer: Self-pay

## 2024-05-30 DIAGNOSIS — R55 Syncope and collapse: Secondary | ICD-10-CM

## 2024-05-30 DIAGNOSIS — K5732 Diverticulitis of large intestine without perforation or abscess without bleeding: Secondary | ICD-10-CM | POA: Diagnosis not present

## 2024-05-30 DIAGNOSIS — F17200 Nicotine dependence, unspecified, uncomplicated: Secondary | ICD-10-CM | POA: Diagnosis not present

## 2024-05-30 DIAGNOSIS — K5792 Diverticulitis of intestine, part unspecified, without perforation or abscess without bleeding: Secondary | ICD-10-CM

## 2024-05-30 DIAGNOSIS — R791 Abnormal coagulation profile: Secondary | ICD-10-CM | POA: Insufficient documentation

## 2024-05-30 DIAGNOSIS — R1032 Left lower quadrant pain: Secondary | ICD-10-CM | POA: Diagnosis present

## 2024-05-30 DIAGNOSIS — J45909 Unspecified asthma, uncomplicated: Secondary | ICD-10-CM | POA: Diagnosis not present

## 2024-05-30 DIAGNOSIS — D72829 Elevated white blood cell count, unspecified: Secondary | ICD-10-CM | POA: Diagnosis not present

## 2024-05-30 LAB — D-DIMER, QUANTITATIVE: D-Dimer, Quant: 0.67 ug{FEU}/mL — ABNORMAL HIGH (ref 0.00–0.50)

## 2024-05-30 LAB — CBC
HCT: 38.9 % (ref 36.0–46.0)
Hemoglobin: 13.1 g/dL (ref 12.0–15.0)
MCH: 28.9 pg (ref 26.0–34.0)
MCHC: 33.7 g/dL (ref 30.0–36.0)
MCV: 85.9 fL (ref 80.0–100.0)
Platelets: 245 K/uL (ref 150–400)
RBC: 4.53 MIL/uL (ref 3.87–5.11)
RDW: 14.4 % (ref 11.5–15.5)
WBC: 10.7 K/uL — ABNORMAL HIGH (ref 4.0–10.5)
nRBC: 0 % (ref 0.0–0.2)

## 2024-05-30 LAB — URINALYSIS, ROUTINE W REFLEX MICROSCOPIC
Bacteria, UA: NONE SEEN
Bilirubin Urine: NEGATIVE
Glucose, UA: NEGATIVE mg/dL
Ketones, ur: NEGATIVE mg/dL
Leukocytes,Ua: NEGATIVE
Nitrite: NEGATIVE
Protein, ur: 30 mg/dL — AB
Specific Gravity, Urine: 1.012 (ref 1.005–1.030)
pH: 7.5 (ref 5.0–8.0)

## 2024-05-30 LAB — COMPREHENSIVE METABOLIC PANEL WITH GFR
ALT: 12 U/L (ref 0–44)
AST: 17 U/L (ref 15–41)
Albumin: 4.3 g/dL (ref 3.5–5.0)
Alkaline Phosphatase: 85 U/L (ref 38–126)
Anion gap: 9 (ref 5–15)
BUN: 11 mg/dL (ref 6–20)
CO2: 25 mmol/L (ref 22–32)
Calcium: 10.8 mg/dL — ABNORMAL HIGH (ref 8.9–10.3)
Chloride: 100 mmol/L (ref 98–111)
Creatinine, Ser: 0.89 mg/dL (ref 0.44–1.00)
GFR, Estimated: >60 mL/min (ref >60)
Glucose, Bld: 101 mg/dL — ABNORMAL HIGH (ref 70–99)
Potassium: 4.3 mmol/L (ref 3.5–5.1)
Sodium: 134 mmol/L — ABNORMAL LOW (ref 135–145)
Total Bilirubin: 0.7 mg/dL (ref 0.0–1.2)
Total Protein: 7.6 g/dL (ref 6.5–8.1)

## 2024-05-30 LAB — PRO BRAIN NATRIURETIC PEPTIDE: Pro Brain Natriuretic Peptide: <50 pg/mL (ref <300.0)

## 2024-05-30 LAB — LIPASE, BLOOD: Lipase: 26 U/L (ref 11–51)

## 2024-05-30 LAB — TROPONIN T, HIGH SENSITIVITY: Troponin T High Sensitivity: <15 ng/L (ref 0–19)

## 2024-05-30 MED ORDER — SODIUM CHLORIDE 0.9 % IV BOLUS
1000.0000 mL | Freq: Once | INTRAVENOUS | Status: AC
  Administered 2024-05-30: 1000 mL via INTRAVENOUS

## 2024-05-30 MED ORDER — DIPHENHYDRAMINE HCL 25 MG PO CAPS: 50.0000 mg | ORAL_CAPSULE | Freq: Once | ORAL | Status: AC

## 2024-05-30 MED ORDER — DIPHENHYDRAMINE HCL 50 MG/ML IJ SOLN
50.0000 mg | Freq: Once | INTRAMUSCULAR | Status: AC
  Administered 2024-05-30: 50 mg via INTRAVENOUS
  Filled 2024-05-30: qty 1

## 2024-05-30 MED ORDER — METHYLPREDNISOLONE SODIUM SUCC 40 MG IJ SOLR
40.0000 mg | Freq: Once | INTRAMUSCULAR | Status: AC
  Administered 2024-05-30: 40 mg via INTRAVENOUS
  Filled 2024-05-30: qty 1

## 2024-05-30 MED ORDER — MECLIZINE HCL 25 MG PO TABS
25.0000 mg | ORAL_TABLET | Freq: Once | ORAL | Status: AC
  Administered 2024-05-30: 25 mg via ORAL
  Filled 2024-05-30: qty 1

## 2024-05-30 MED ORDER — ACETAMINOPHEN 500 MG PO TABS
1000.0000 mg | ORAL_TABLET | Freq: Once | ORAL | Status: AC
  Administered 2024-05-30: 1000 mg via ORAL
  Filled 2024-05-30: qty 2

## 2024-05-30 MED ORDER — AMOXICILLIN-POT CLAVULANATE 875-125 MG PO TABS: 1.0000 | ORAL_TABLET | Freq: Two times a day (BID) | ORAL | 0 refills | Status: DC

## 2024-05-30 MED ORDER — IOHEXOL 350 MG/ML SOLN
75.0000 mL | Freq: Once | INTRAVENOUS | Status: AC | PRN
  Administered 2024-05-30: 75 mL via INTRAVENOUS

## 2024-05-30 MED ORDER — IOHEXOL 350 MG/ML SOLN: 100.0000 mL | Freq: Once | INTRAVENOUS | Status: DC | PRN

## 2024-05-30 NOTE — ED Provider Notes (Signed)
 48yo female with abdominal pain, similar to prior diverticulitis. Syncopal episode today, CT head/neck normal.  CT a/p with diverticulitis, abx to pharmacy. Awaiting CT PE study, no hx of PE, no SHOB/tachycardia. EKG with new t-wave changes. Also awaiting BNP and trop.  Premedicated for iodine allergy.  CT ~6pm Physical Exam  BP 111/75   Pulse 85   Temp 98.6 F (37 C) (Oral)   Resp 17   LMP  (LMP Unknown)   SpO2 95%   Physical Exam  Procedures  Procedures  ED Course / MDM   Clinical Course as of 05/30/24 1626  Wed May 30, 2024  1011 EKG 12-Lead Sinus rhythm, T wave abnormalities in 3 and aVF appear new [JT]  1039 Patient with history of diverticulitis evaluated for complaints of unwitnessed syncopal episode now with head pain and dizziness in addition to abdominal pain since yesterday.  Patient is hemodynamically stable.  On exam she has tenderness to the left lower quadrant.  She has no neurodeficits on exam.  She has normal cephalic atraumatic.  Has generalized cervical tenderness.  Will obtain abdominal labs in addition to CT abdomen pelvis will additionally scan her head and cervical spine.  Given syncope with EKG changes we will obtain PE study as well. [JT]  1041 CBC(!) Mild leukocytosis of 10.7  Patient reportedly has iodine allergy.  CT abdomen without contrast performed.  CT angio PE replaced with dimer. [JT]  1057 Comprehensive metabolic panel(!) Calcium mildly elevated otherwise no significant abnormality [JT]  1057 Lipase, blood Within normal limits [JT]  1218 CT Head Wo Contrast No acute intracranial abnormality [JT]  1218 CT Cervical Spine Wo Contrast No evidence of fracture [JT]  1232 D-dimer, quantitative(!) Elevated to 0.67.  Upon further discussion patient reports that she may have had a skin reaction to iodine on her skin when she was younger.  She has had contrast studies since then.  I see no evidence of anaphylaxis prophylaxis.  Will proceed with PE  study. [JT]  1247 Discussed with CT who consulted rads.  Requesting anaphylaxis prophylaxis. [JT]    Clinical Course User Index [JT] Donnajean Lynwood DEL, PA-C   Medical Decision Making Amount and/or Complexity of Data Reviewed Labs: ordered. Decision-making details documented in ED Course. Radiology: ordered. Decision-making details documented in ED Course. ECG/medicine tests:  Decision-making details documented in ED Course.  Risk OTC drugs. Prescription drug management.   Troponin negative, BNP WNL. CT negative for PE. Dc per plan from prior provider with abx for diverticulitis. Recheck with PCP in 2 days, Return to ER for worsening or concerning symptoms.         Beverley Leita LABOR, PA-C 05/30/24 1904    Yolande Lamar BROCKS, MD 06/01/24 1910

## 2024-05-30 NOTE — ED Notes (Signed)
 Reviewed discharge instructions, medications, and home care with pt. Pt verbalized understanding and had no further questions. Pt exited ED without complications.

## 2024-05-30 NOTE — ED Notes (Signed)
 Patient transported to CT

## 2024-05-30 NOTE — ED Provider Notes (Signed)
 [place] EMERGENCY DEPARTMENT AT [facility] Provider Note   CSN: [id number] Arrival date & time: 05/30/24  9059     Patient presents with: Loss of Consciousness   Sherry Hunt is a 48 y.o. female with history of diverticulitis, vertigo presents with complaints of unwitnessed syncopal episode.  Patient reports that she has been having left lower quadrant abdominal pain over the past day.  This associate with nausea without vomiting or diarrhea.  No urinary or vaginal symptoms.  Reports that she has not been eating since yesterday.  States that she was sitting with a high stool started feeling dizziness the next thing she knew she woke up on the floor.  She complains of head pain primarily on the right with associated dizziness.  No vision changes or extremity weakness or numbness.  Has not been vomiting.  Has been ambulatory since.  No history of seizures.  There was no urinary incontinence.  Was not confused following the syncopal episode.  Denies any cardiac history.  No prior blood clots.  No recent surgeries, travel or hospitalizations.  She is without any active chest pain or shortness of breath.    Loss of Consciousness  Past Medical History:  Diagnosis Date   Asthma    Depression    diverticulitis    Endometrial polyp    AND INTRAUTERINE LESIONS   Gallstones    GERD (gastroesophageal reflux disease)    Lung nodule    awaiting CT scan   Menorrhagia    Smoker    Past Surgical History:  Procedure Laterality Date   DILATATION & CURETTAGE/HYSTEROSCOPY WITH MYOSURE N/A 02/11/2017   Procedure: DILATATION & CURETTAGE/HYSTEROSCOPY WITH MYOSURE;  Surgeon: Lavoie, Marie-Lyne, MD;  Location: [facility];  Service: Gynecology;  Laterality: N/A;  request 1:00pm OR time  request one hour   Liposunction     REDUCTION MAMMAPLASTY Bilateral 2009   and Liposuction       Prior to Admission medications   Medication Sig Start Date End Date  Taking? Authorizing Provider  amoxicillin -clavulanate (AUGMENTIN ) 875-125 MG tablet Take 1 tablet by mouth every 12 (twelve) hours. 05/30/24  Yes Donnajean Lynwood DEL, PA-C  albuterol  (VENTOLIN  HFA) 108 (90 Base) MCG/ACT inhaler Inhale 2 puffs into the lungs every 6 (six) hours as needed. 07/24/23   [provider]  clotrimazole-betamethasone (LOTRISONE) cream Apply to affected ear 1-2 times daily x 1 week 05/02/24   [provider]  pantoprazole  (PROTONIX ) 40 MG tablet Take 1 tablet (40 mg total) by mouth daily. 04/16/24   May, Deanna J, NP  triamcinolone  cream (KENALOG ) 0.1 % Apply 1 Application topically 2 (two) times daily. Apply a very small amount to both external ears for itching. Cover with a moisturizer. 04/23/24   Lucius Krabbe, NP    Allergies: Iodine    Review of Systems  Cardiovascular:  Positive for syncope.    Updated Vital Signs BP 111/75   Pulse 85   Temp 98.6 F (37 C) (Oral)   Resp 17   LMP  (LMP Unknown)   SpO2 95%   Physical Exam Vitals and nursing note reviewed.  Constitutional:      General: She is not in acute distress.    Appearance: She is well-developed.  HENT:     Head: Normocephalic and atraumatic.     Comments: no lateral tongue lacerations Eyes:     Conjunctiva/sclera: Conjunctivae normal.  Cardiovascular:     Rate and Rhythm: Normal rate and regular rhythm.  Heart sounds: No murmur heard. Pulmonary:     Effort: Pulmonary effort is normal. No respiratory distress.     Breath sounds: Normal breath sounds.  Abdominal:     Palpations: Abdomen is soft.     Tenderness: There is abdominal tenderness.     Comments: Tender left lower quadrant, soft nondistended  Musculoskeletal:        General: No swelling.     Cervical back: Neck supple.     Comments: Mild generalized cervical tendernesss  Skin:    General: Skin is warm and dry.     Capillary Refill: Capillary refill takes less than 2 seconds.  Neurological:     Mental  Status: She is alert.     Comments: Patient is alert and oriented. There is no abnormal phonation. Symmetric smile without facial droop.Moves all extremities spontaneously. 5/5 strength in upper and lower extremities. . No sensation deficit. There is no nystagmus. EOMI, PERRL. Coordination intact with finger to nose.    Psychiatric:        Mood and Affect: Mood normal.     (all labs ordered are listed, but only abnormal results are displayed) Labs Reviewed  COMPREHENSIVE METABOLIC PANEL WITH GFR - Abnormal; Notable for the following components:      Result Value   Sodium 134 (*)    Glucose, Bld 101 (*)    Calcium 10.8 (*)    All other components within normal limits  CBC - Abnormal; Notable for the following components:   WBC 10.7 (*)    All other components within normal limits  URINALYSIS, ROUTINE W REFLEX MICROSCOPIC - Abnormal; Notable for the following components:   Hgb urine dipstick SMALL (*)    Protein, ur 30 (*)    All other components within normal limits  D-DIMER, QUANTITATIVE - Abnormal; Notable for the following components:   D-Dimer, Quant 0.67 (*)    All other components within normal limits  LIPASE, BLOOD  PRO BRAIN NATRIURETIC PEPTIDE  TROPONIN T, HIGH SENSITIVITY    EKG: EKG Interpretation Date/Time:  Wednesday May 30 2024 09:49:42 EDT Ventricular Rate:  101 PR Interval:  142 QRS Duration:  86 QT Interval:  336 QTC Calculation: 435 R Axis:   54  Text Interpretation: Sinus tachycardia T wave abnormality, consider inferior ischemia Abnormal ECG When compared with ECG of 05-Jan-2022 06:02, PREVIOUS ECG IS PRESENT Confirmed by Tonia Chew [phone]) on 05/30/2024 10:07:50 AM  Radiology: CT ABDOMEN PELVIS WO CONTRAST Result Date: 05/30/2024 EXAM: CT ABDOMEN AND PELVIS WITHOUT CONTRAST 05/30/2024 11:52:24 AM TECHNIQUE: CT of the abdomen and pelvis was performed without the administration of intravenous contrast. Multiplanar reformatted images are provided  for review. Automated exposure control, iterative reconstruction, and/or weight-based adjustment of the mA/kV was utilized to reduce the radiation dose to as low as reasonably achievable. COMPARISON: 01/05/2022 CLINICAL HISTORY: LLQ abdominal pain. FINDINGS: LOWER CHEST: Mild atelectasis or scarring in the posterior basal segments of both lower lobes. LIVER: The liver is unremarkable. GALLBLADDER AND BILE DUCTS: 1.8 cm filling defect in the gallbladder favoring gallstone. No biliary ductal dilatation. SPLEEN: No acute abnormality. PANCREAS: No acute abnormality. ADRENAL GLANDS: No acute abnormality. KIDNEYS, URETERS AND BLADDER: Borderline right hydroureter proximal to the iliac vessel crossover. No urinary tract calculi identified. No perinephric or periureteral stranding. Urinary bladder is unremarkable. GI AND BOWEL: Stomach demonstrates no acute abnormality. Substantial widespread colonic diverticulosis. Active diverticulitis at the junction of the descending and sigmoid colon with substantial pericolic abnormality of fluid and inflammatory stranding.  There is a second focus of more mild diverticulitis posteriorly along the ascending colon, for example on image 41 series 2 with mild pericolic edema. Normal appendix. PERITONEUM AND RETROPERITONEUM: No extraluminal gas or abscess. No ascites. No free air. VASCULATURE: Aorta is normal in caliber. LYMPH NODES: No lymphadenopathy. REPRODUCTIVE ORGANS: Lobularity of the posterior uterine body probably reflecting a subserosal fibroid. BONES AND SOFT TISSUES: Grade 1 anterolisthesis at L4-L5 associated with substantial L4-L5 degenerative facet arthropathy. No focal soft tissue abnormality. IMPRESSION: 1. Active moderate diverticulitis at the junction of the descending and sigmoid colon with substantial pericolic inflammatory change. No extraluminal gas or abscess. 2. Second, milder diverticulitis focus along the ascending colon with mild pericolic edema. 3. Borderline  right hydroureter proximal to the iliac vessel crossover without urinary tract calculi. 4. Cholelithiasis. 5. Grade 1 L4-5 anterolisthesis with substantial L4-5 degenerative facet arthropathy. Electronically signed by: Ryan Salvage MD 05/30/2024 01:39 PM EDT RP Workstation: [id number]   CT Cervical Spine Wo Contrast Result Date: 05/30/2024 EXAM: CT CERVICAL SPINE WITHOUT CONTRAST 05/30/2024 11:52:24 AM TECHNIQUE: CT of the cervical spine was performed without the administration of intravenous contrast. Multiplanar reformatted images are provided for review. Automated exposure control, iterative reconstruction, and/or weight based adjustment of the mA/kV was utilized to reduce the radiation dose to as low as reasonably achievable. COMPARISON: None available. CLINICAL HISTORY: Neck trauma, uncomplicated (NEXUS/CCR neg) (Age 15-64y). Table formatting from the original note was not included.; Images from the original note were not included.; ; Signed ; Pt to ED from home with c/o an unwitnessed fall from a chair. Pt had a syncopal episode while she was in a seated position for an unknown time period. When daughter arrived pt was still on the floor. Pt has a history of diverticulosis and can't eat much ; when this flares up and has been restricting oral intake due to this for the past couple of days. Pt also reports not being able to have a BM. Arrives A+O, VSS, NADN. Pt to ED from home with c/o an unwitnessed fall from a chair. Pt had a syncopal episode while she was in a seated position for an unknown time period. When daughter arrived pt was still on the floor. Pt has a history of diverticulosis and can't eat much ; when this flares up and has been restricting oral intake due to this for the past couple of days. Pt also reports not being able to have a BM. Arrives A+O, VSS, NADN. FINDINGS: CERVICAL SPINE: BONES AND ALIGNMENT: Straightening of the normal cervical lordosis. No acute fracture. No evidence of  traumatic malalignment. DEGENERATIVE CHANGES: No significant degenerative changes. SOFT TISSUES: No prevertebral soft tissue swelling. IMPRESSION: 1. No acute abnormality of the cervical spine. Electronically signed by: Donnice Mania MD 05/30/2024 12:04 PM EDT RP Workstation: [id number]   CT Head Wo Contrast Result Date: 05/30/2024 EXAM: CT HEAD WITHOUT CONTRAST 05/30/2024 11:52:24 AM TECHNIQUE: CT of the head was performed without the administration of intravenous contrast. Automated exposure control, iterative reconstruction, and/or weight based adjustment of the mA/kV was utilized to reduce the radiation dose to as low as reasonably achievable. COMPARISON: None available. CLINICAL HISTORY: Head trauma, HA, dizziness. Pt to ED from home with c/o an unwitnessed fall from a chair. Pt had a syncopal episode while she was in a seated position for an unknown time period. When daughter arrived pt was still on the floor. Pt has a history of diverticulosis and can't eat much when this flares up and has  been restricting oral intake due to this for the past couple of days. Pt also reports not being able to have a BM. Arrives A+O, VSS, NADN. FINDINGS: BRAIN AND VENTRICLES: Cerebral tonsillar ectopia is slightly more pronounced on the right with tonsils extending approximately 3 mm below the foramen magnum without significant crowding of the foramen magnum. Findings are likely chronic. There is no evidence of cerebral edema. No acute hemorrhage. No evidence of acute infarct. No hydrocephalus. No extra-axial collection. No mass effect or midline shift. ORBITS: No acute abnormality. SINUSES: No acute abnormality. SOFT TISSUES AND SKULL: No acute soft tissue abnormality. No skull fracture. IMPRESSION: 1. No acute intracranial abnormality. 2. 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 Electronically signed by: Donnice Mania MD  05/30/2024 11:59 AM EDT RP Workstation: [id number]     Procedures   Medications Ordered in the ED  diphenhydrAMINE (BENADRYL) capsule 50 mg (has no administration in time range)    Or  diphenhydrAMINE (BENADRYL) injection 50 mg (has no administration in time range)  sodium chloride  0.9 % bolus 1,000 mL (0 mLs Intravenous Stopped 05/30/24 1212)  meclizine  (ANTIVERT ) tablet 25 mg (25 mg Oral Given 05/30/24 1113)  methylPREDNISolone sodium succinate (SOLU-MEDROL) 40 mg/mL injection 40 mg (40 mg Intravenous Given 05/30/24 1400)  acetaminophen  (TYLENOL ) tablet 1,000 mg (1,000 mg Oral Given 05/30/24 1358)    Clinical Course as of 05/30/24 1629  Wed May 30, 2024  1011 EKG 12-Lead Sinus rhythm, T wave abnormalities in 3 and aVF appear new [JT]  1039 Patient with history of diverticulitis evaluated for complaints of unwitnessed syncopal episode now with head pain and dizziness in addition to abdominal pain since yesterday.  Patient is hemodynamically stable.  On exam she has tenderness to the left lower quadrant.  She has no neurodeficits on exam.  She has normal cephalic atraumatic.  Has generalized cervical tenderness.  Will obtain abdominal labs in addition to CT abdomen pelvis will additionally scan her head and cervical spine.  Given syncope with EKG changes we will obtain PE study as well. [JT]  1041 CBC(!) Mild leukocytosis of 10.7  Patient reportedly has iodine allergy.  CT abdomen without contrast performed.  CT angio PE replaced with dimer. [JT]  1057 Comprehensive metabolic panel(!) Calcium mildly elevated otherwise no significant abnormality [JT]  1057 Lipase, blood Within normal limits [JT]  1218 CT Head Wo Contrast No acute intracranial abnormality [JT]  1218 CT Cervical Spine Wo Contrast No evidence of fracture [JT]  1232 D-dimer, quantitative(!) Elevated to 0.67.  Upon further discussion patient reports that she may have had a skin reaction to iodine on her skin when she  was younger.  She has had contrast studies since then.  I see no evidence of anaphylaxis prophylaxis.  Will proceed with PE study. [JT]  1247 Discussed with CT who consulted rads.  Requesting anaphylaxis prophylaxis. [JT]    Clinical Course User Index [JT] Donnajean Lynwood DEL, PA-C                                 Medical Decision Making Amount and/or Complexity of Data Reviewed Labs: ordered. Decision-making details documented in ED Course. Radiology: ordered. Decision-making details documented in ED Course. ECG/medicine tests:  Decision-making details documented in ED Course.  Risk OTC drugs. Prescription drug management.   This patient presents to the ED with chief complaint(s) of  syncope.  The complaint involves an extensive differential diagnosis and also carries with it a high risk of complications and morbidity.   Pertinent past medical history as listed in HPI  The differential diagnosis includes  ACS, PE, diverticulitis, UTI, pyelonephritis, nephrolithiasis, intracranial hemorrhage, fracture Additional history obtained: Additional history obtained from family Records reviewed Care Everywhere/External Records  Disposition:   Signout given to Leita Chancy, PA-C.  Please see her note for remainder the visit.  Disposition pending workup.  Social Determinants of Health:   none  This note was dictated with voice recognition software.  Despite best efforts at proofreading, errors may have occurred which can change the documentation meaning.       Final diagnoses:  Diverticulitis    ED Discharge Orders          Ordered    amoxicillin -clavulanate (AUGMENTIN ) 875-125 MG tablet  Every 12 hours        05/30/24 1615               Donnajean Lynwood DEL, PA-C 05/30/24 1629    Tonia Chew, MD 05/31/24 1320

## 2024-05-30 NOTE — ED Notes (Signed)
 Gave patient urine cup. Patient is waiting in lobby will try to obtain while she waits.

## 2024-05-30 NOTE — ED Notes (Signed)
 Pt reports receiving flu vaccine and another vaccine yesterday afternoon

## 2024-05-30 NOTE — Telephone Encounter (Signed)
 FOR PHARMACY COVERAGE   Per test claim: PA required; PA started via CoverMyMeds. KEY [id number] . Please see clinical question(s) below that I am not finding the answer to in their chart and advise.

## 2024-05-30 NOTE — Telephone Encounter (Signed)
 PHARMACY PA SUBMITTED VIA LATENT. KEY: [id number]

## 2024-05-30 NOTE — ED Triage Notes (Signed)
 Pt to ED from home with c/o an unwitnessed fall from a chair. Pt had a syncopal episode while she was in a seated position for an unknown time period. When daughter arrived pt was still on the floor. Pt has a history of diverticulosis and can't eat much when this flares up and has been restricting oral intake due to this for the past couple of days. Pt also reports not being able to have a BM. Arrives A+O, VSS, NADN.

## 2024-05-30 NOTE — Telephone Encounter (Signed)
 NOT COVERED UNDER MEDICAL

## 2024-05-30 NOTE — Discharge Instructions (Addendum)
 Take antibiotics as prescribed and complete the full course. Recheck with your doctor in 2 days. Return to the ER for worsening or concerning symptoms.

## 2024-05-30 NOTE — Telephone Encounter (Signed)
 FYI Only or Action Required?: FYI only for provider: ED advised.  Patient was last seen in primary care on 04/23/2024 by Lucius Krabbe, NP.  Called Nurse Triage reporting Loss of Consciousness.  Symptoms began today.  Interventions attempted: Nothing.  Symptoms are: gradually improving.  Triage Disposition: Go to ED Now (Notify PCP)  Patient/caregiver understands and will follow disposition?: Yes     Copied from CRM [id number]. Topic: Clinical - Red Word Triage >> May 30, 2024  9:06 AM Pinkey ORN wrote: Red Word that prompted transfer to Nurse Triage: Fainted >> May 30, 2024  9:09 AM Pinkey ORN wrote: Patient fainted this morning and is experiencing some pain on her head. Patient's daughter Roe is interrupting for her.  Reason for Disposition  [1] Fainted > 15 minutes ago AND [2] still feels weak or dizzy  Answer Assessment - Initial Assessment Questions Pt's daughter states that mother passed out about 0750 this morning. She was alone and was by herself. It does appear pt hit her head. Dtr states pt doesn't think that she has a concussion. Dtr states pt has lots going on so they aren't sure what it could be. She is being treated for a fungus in her ear, only drank fluids yesterday and multiple other things currently.  RN advised pt's daughter to take patient to the ER.   1. ONSET: How long were you unconscious? (e.g., minutes, seconds) When did it happen?     This morning  2. CONTENT: What happened during the period of unconsciousness? (e.g., seizure activity)      unknown 3. MENTAL STATUS: Alert and oriented now? (e.g., oriented x 3 = name, month, location)      Awake but dtr said she had to help her up off the floor 4. TRIGGER: What do you think caused the fainting? What were you doing just before you fainted?  (e.g., exercise, sudden standing up, prolonged standing)     unknown 5. RECURRENT SYMPTOM: Have you ever passed out before? If Yes, ask:  When was the last time? and What happened that time?      unknown 6. INJURY: Did you hurt yourself when you fell?      Dtr said it appears mother hit her head  Protocols used: Fainting-A-AH

## 2024-05-30 NOTE — ED Notes (Signed)
 ED Provider at bedside.

## 2024-05-31 NOTE — Telephone Encounter (Signed)
 Pharmacy Patient Advocate Encounter  Received notification from [facility] [employer] that Prior Authorization for PROLIA has been DENIED.  Full denial letter will be uploaded to the media tab. See denial reason below.   PA #/Case ID/Reference #: [id number]

## 2024-06-05 ENCOUNTER — Telehealth: Payer: Self-pay | Admitting: Gastroenterology

## 2024-06-05 NOTE — Telephone Encounter (Signed)
 Lm on vm for patient to return call via interpreter services (Interpreter ID: [phone]).

## 2024-06-05 NOTE — Progress Notes (Unsigned)
 06/06/2024 Sherry Hunt [id number] August 06, 1975  Referring provider: Lucius Krabbe, NP Primary GI doctor: Dr. Charlanne  ASSESSMENT AND PLAN:  History of diverticulitis 05/30/2024 03/2022 colonoscopy recall 10 years 05/30/2024 CTAP W0 showed unremarkable liver 1.8 cm filling defect gallbladder favoring gallstone no ductal dilation widespread colonic diverticulosis with active diverticulitis at junction of descending and sigmoid colon fluid inflammatory stranding second focus of more mild diverticulitis posterior ascending colon no ascites no complications Treated with augmentin  BID for 5 days Continue to have right AB pain, only able to drink liquids, worse with movement, last BM was Monday and hard, she is passing gas Denies fever, chills, nausea, vomiting Persistent lower abdominal pain likely due to acute diverticulitis. Completed 5-day Augmentin  course, symptoms continue. No signs of perforation or abscess. Symptoms align with diverticulitis over gallstones. - Ordered stat labs for dehydration and liver function. - Prescribed 10-day antibiotic course with cipro /flagyl  - Provided antispasm medication for pain. - Advised continuation of liquid diet. - Provided dietary guidance for diverticulitis. - Instructed use of heating pad for pain relief. - Advised ER visit if labs show dehydration or significant liver dysfunction. - consider repeat CT scan versus ER  GERD with intermittent epigastric pain and cholelithiasis 03/2022 EGD unremarkable Referred to general surgery - Prescribed omeprazole  for stomach protection and reflux management. - continue follow up with general surgery  Patient Care Team: Lucius Krabbe, NP as PCP - General (Family Medicine)  HISTORY OF PRESENT ILLNESS: 48 y.o. female with a past medical history listed below presents for evaluation of diverticulitis.   Patient last seen in the office 04/16/2024 by Cathryne May, NP  Discussed the use  of AI scribe software for clinical note transcription with the patient, who gave verbal consent to proceed.  History of Present Illness   Sherry Hunt is a 48 year old female with diverticulitis who presents with severe abdominal pain.  She has been experiencing severe abdominal pain for five days, localized without radiation to the back. The pain is described as spasm-like and worsens with movement. No fever, chills, nausea, or vomiting are present. Bowel movements have been limited, with the last one occurring on Monday, but she is passing gas. The pain is described as 'really bad', and she feels very weak.  A CT scan on October 29th revealed gallstones and diverticulosis with active diverticulitis. She completed a five-day course of Augmentin  two days ago, but symptoms persist with significant discomfort. She was previously seen by a nurse practitioner in September.  She reports a metallic taste in her mouth and denies shortness of breath, chest pain, or dizziness. Due to pain and weakness, she has been on a liquid diet. She is not allergic to antibiotics but has a known allergy to iodine contrast, requiring premedication with anti-allergy medications and Benadryl during her CT scan.  Her current medications include over-the-counter famotidine  and calcium supplements. She has previously tried omeprazole  for reflux but is unsure of its effectiveness. She is not currently taking pantoprazole .        She  reports that she has been smoking cigarettes. She started smoking about 17 years ago. She has a 17.4 pack-year smoking history. She has never used smokeless tobacco. She reports current alcohol use. She reports that she does not use drugs.  RELEVANT GI HISTORY, IMAGING AND LABS: Results   RADIOLOGY Abdominal CT: Cholelithiasis and diverticulosis with active diverticulitis (05/30/2024)      CBC    Component Value Date/Time   WBC 8.0  06/06/2024 0931   RBC 4.42 06/06/2024  0931   HGB 12.8 06/06/2024 0931   HCT 38.1 06/06/2024 0931   PLT 276.0 06/06/2024 0931   MCV 86.1 06/06/2024 0931   MCH 28.9 05/30/2024 1005   MCHC 33.6 06/06/2024 0931   RDW 14.1 06/06/2024 0931   LYMPHSABS 2.2 06/06/2024 0931   MONOABS 0.7 06/06/2024 0931   EOSABS 0.3 06/06/2024 0931   BASOSABS 0.0 06/06/2024 0931   Recent Labs    05/30/24 1005 06/06/24 0931  HGB 13.1 12.8    CMP     Component Value Date/Time   NA 138 06/06/2024 0931   K 4.5 06/06/2024 0931   CL 101 06/06/2024 0931   CO2 29 06/06/2024 0931   GLUCOSE 85 06/06/2024 0931   BUN 12 06/06/2024 0931   CREATININE 0.95 06/06/2024 0931   CREATININE 0.83 02/14/2024 1037   CALCIUM 10.1 06/06/2024 0931   PROT 7.3 06/06/2024 0931   ALBUMIN 4.0 06/06/2024 0931   AST 12 06/06/2024 0931   ALT 12 06/06/2024 0931   ALKPHOS 70 06/06/2024 0931   BILITOT 0.5 06/06/2024 0931   GFRNONAA >60 05/30/2024 1005   GFRAA >60 02/06/2019 0800      Latest Ref Rng & Units 06/06/2024    9:31 AM 05/30/2024   10:05 AM 02/14/2024   10:37 AM  Hepatic Function  Total Protein 6.0 - 8.3 g/dL 7.3  7.6  7.5   Albumin 3.5 - 5.2 g/dL 4.0  4.3    AST 0 - 37 U/L 12  17  17    ALT 0 - 35 U/L 12  12  18    Alk Phosphatase 39 - 117 U/L 70  85    Total Bilirubin 0.2 - 1.2 mg/dL 0.5  0.7  0.4       Current Medications:     Current Outpatient Medications (Respiratory):    albuterol  (VENTOLIN  HFA) 108 (90 Base) MCG/ACT inhaler, Inhale 2 puffs into the lungs every 6 (six) hours as needed.    Current Outpatient Medications (Other):    ciprofloxacin  (CIPRO ) 500 MG tablet, Take 1 tablet (500 mg total) by mouth 2 (two) times daily.   clotrimazole-betamethasone (LOTRISONE) cream, Apply to affected ear 1-2 times daily x 1 week   dicyclomine (BENTYL) 20 MG tablet, Take 1 tablet (20 mg total) by mouth every 8 (eight) hours as needed for spasms (AB pain).   metroNIDAZOLE  (FLAGYL ) 500 MG tablet, Take 1 tablet (500 mg total) by mouth 3 (three) times  daily.   omeprazole  (PRILOSEC) 40 MG capsule, Take 1 capsule (40 mg total) by mouth daily before breakfast.   triamcinolone  cream (KENALOG ) 0.1 %, Apply 1 Application topically 2 (two) times daily. Apply a very small amount to both external ears for itching. Cover with a moisturizer.  Medical History:  Past Medical History:  Diagnosis Date   Asthma    Depression    diverticulitis    Endometrial polyp    AND INTRAUTERINE LESIONS   Gallstones    GERD (gastroesophageal reflux disease)    Lung nodule    awaiting CT scan   Menorrhagia    Smoker    Allergies:  Allergies  Allergen Reactions   Iodine Other (See Comments)    iodine on skin causes irritation Other reaction(s): rash     Surgical History:  She  has a past surgical history that includes Reduction mammaplasty (Bilateral, 2009); Dilatation & curettage/hysteroscopy with myosure (N/A, 02/11/2017); and Liposunction. Family History:  Her family history includes  Asthma in her sister; Cancer in her maternal aunt and mother; Colon cancer in her maternal aunt; Diabetes in her mother; Hypertension in her father and mother; Hyperthyroidism in her sister; Pancreatic cancer in her mother; Stroke in her father.  REVIEW OF SYSTEMS  : All other systems reviewed and negative except where noted in the History of Present Illness.  PHYSICAL EXAM: BP 102/66   Pulse 68   Ht 5' 2 (1.575 m)   Wt 152 lb (68.9 kg)   LMP  (LMP Unknown)   BMI 27.80 kg/m  Physical Exam   GENERAL APPEARANCE: Well nourished, in no apparent distress. HEENT: No cervical lymphadenopathy, unremarkable thyroid , sclerae anicteric, conjunctiva pink. RESPIRATORY: Respiratory effort normal, breath sounds equal bilaterally without rales, rhonchi, or wheezing. CARDIO: Regular rate and rhythm with no murmurs, rubs, or gallops, peripheral pulses intact. ABDOMEN: Soft, non-distended, active bowel sounds in all four quadrants, localized abdominal pain, no rebound tenderness,  no mass appreciated. RECTAL: Declines. MUSCULOSKELETAL: Full range of motion, normal gait, without edema. SKIN: Dry, intact without rashes or lesions. No jaundice. NEURO: Alert, oriented, no focal deficits. PSYCH: Cooperative, normal mood and affect.      Alan JONELLE Coombs, PA-C 10:57 AM

## 2024-06-05 NOTE — Telephone Encounter (Signed)
 Inbound call from patient stating that she has had a diverticulitis flare up and went to the ED for it and was given some medication for it. Patient since then has been in a lot of abdominal pain. Patient is requesting to a call back to see what she needs to do. Please advise.  FYI: Patient will be needing a spanish interpreter.

## 2024-06-05 NOTE — Telephone Encounter (Signed)
 Patient returned call. Patient has been scheduled for hospital follow up tomorrow at 8:40 am with [place], [place].

## 2024-06-06 ENCOUNTER — Ambulatory Visit (INDEPENDENT_AMBULATORY_CARE_PROVIDER_SITE_OTHER): Admitting: Physician Assistant

## 2024-06-06 ENCOUNTER — Ambulatory Visit: Payer: Self-pay | Admitting: Physician Assistant

## 2024-06-06 ENCOUNTER — Encounter: Payer: Self-pay | Admitting: Physician Assistant

## 2024-06-06 ENCOUNTER — Other Ambulatory Visit (INDEPENDENT_AMBULATORY_CARE_PROVIDER_SITE_OTHER)

## 2024-06-06 VITALS — BP 102/66 | HR 68 | Ht 62.0 in | Wt 152.0 lb

## 2024-06-06 DIAGNOSIS — K219 Gastro-esophageal reflux disease without esophagitis: Secondary | ICD-10-CM

## 2024-06-06 DIAGNOSIS — R1011 Right upper quadrant pain: Secondary | ICD-10-CM

## 2024-06-06 DIAGNOSIS — K8020 Calculus of gallbladder without cholecystitis without obstruction: Secondary | ICD-10-CM | POA: Diagnosis not present

## 2024-06-06 DIAGNOSIS — Z8719 Personal history of other diseases of the digestive system: Secondary | ICD-10-CM | POA: Diagnosis not present

## 2024-06-06 LAB — CBC WITH DIFFERENTIAL/PLATELET
Basophils Absolute: 0 K/uL (ref 0.0–0.1)
Basophils Relative: 0.4 % (ref 0.0–3.0)
Eosinophils Absolute: 0.3 K/uL (ref 0.0–0.7)
Eosinophils Relative: 3.7 % (ref 0.0–5.0)
HCT: 38.1 % (ref 36.0–46.0)
Hemoglobin: 12.8 g/dL (ref 12.0–15.0)
Lymphocytes Relative: 27.6 % (ref 12.0–46.0)
Lymphs Abs: 2.2 K/uL (ref 0.7–4.0)
MCHC: 33.6 g/dL (ref 30.0–36.0)
MCV: 86.1 fl (ref 78.0–100.0)
Monocytes Absolute: 0.7 K/uL (ref 0.1–1.0)
Monocytes Relative: 8.7 % (ref 3.0–12.0)
Neutro Abs: 4.8 K/uL (ref 1.4–7.7)
Neutrophils Relative %: 59.6 % (ref 43.0–77.0)
Platelets: 276 K/uL (ref 150.0–400.0)
RBC: 4.42 Mil/uL (ref 3.87–5.11)
RDW: 14.1 % (ref 11.5–15.5)
WBC: 8 K/uL (ref 4.0–10.5)

## 2024-06-06 LAB — COMPREHENSIVE METABOLIC PANEL WITH GFR
ALT: 12 U/L (ref 0–35)
AST: 12 U/L (ref 0–37)
Albumin: 4 g/dL (ref 3.5–5.2)
Alkaline Phosphatase: 70 U/L (ref 39–117)
BUN: 12 mg/dL (ref 6–23)
CO2: 29 meq/L (ref 19–32)
Calcium: 10.1 mg/dL (ref 8.4–10.5)
Chloride: 101 meq/L (ref 96–112)
Creatinine, Ser: 0.95 mg/dL (ref 0.40–1.20)
GFR: 70.07 mL/min (ref >60.00)
Glucose, Bld: 85 mg/dL (ref 70–99)
Potassium: 4.5 meq/L (ref 3.5–5.1)
Sodium: 138 meq/L (ref 135–145)
Total Bilirubin: 0.5 mg/dL (ref 0.2–1.2)
Total Protein: 7.3 g/dL (ref 6.0–8.3)

## 2024-06-06 LAB — LIPASE: Lipase: 8 U/L — ABNORMAL LOW (ref 11.0–59.0)

## 2024-06-06 LAB — SEDIMENTATION RATE: Sed Rate: 68 mm/h — ABNORMAL HIGH (ref 0–30)

## 2024-06-06 MED ORDER — CIPROFLOXACIN HCL 500 MG PO TABS: 500.0000 mg | ORAL_TABLET | Freq: Two times a day (BID) | ORAL | 0 refills | Status: DC

## 2024-06-06 MED ORDER — DICYCLOMINE HCL 20 MG PO TABS: 20.0000 mg | ORAL_TABLET | Freq: Three times a day (TID) | ORAL | 0 refills | Status: DC | PRN

## 2024-06-06 MED ORDER — OMEPRAZOLE 40 MG PO CPDR: 40.0000 mg | DELAYED_RELEASE_CAPSULE | Freq: Every day | ORAL | 2 refills | Status: AC

## 2024-06-06 MED ORDER — METRONIDAZOLE 500 MG PO TABS: 500.0000 mg | ORAL_TABLET | Freq: Three times a day (TID) | ORAL | 0 refills | Status: DC

## 2024-06-06 NOTE — Telephone Encounter (Signed)
 See telephone encounter dated 05/28/24.

## 2024-06-06 NOTE — Patient Instructions (Addendum)
 Your provider has requested that you go to the basement level for lab work before leaving today. Press B on the elevator. The lab is located at the first door on the left as you exit the elevator.  Will give Cipro  and Flagyl   Can take dicyclomine at least 1-2 x a day for pain if needed.   Can do heating pad and can take tylenol  max of 3000mg  a day.  Can add on lidocaine  patches or voltern gel Go to the ER if unable to pass gas, severe AB pain, unable to hold down food, any shortness of breath of chest pain.    During an Acute Flare-Up (Clear Liquid to Low-Fiber Diet) The goal is to reduce irritation and let your colon rest.  Day 1-3: Clear Liquid Diet Water  Broth (chicken, beef, or vegetable)  Clear juices (apple, white grape - avoid citrus)  Ice pops without pulp or seeds  Gelatin (no fruit or seeds)  Tea or coffee (no cream or dairy)  After Symptoms Improve: Low-Fiber Diet Gradually transition to low-fiber foods for easier digestion.  Sample Foods White rice, pasta, or plain white bread  Cooked or canned vegetables without skins or seeds (e.g., carrots, green beans, potatoes)  Eggs, fish, or poultry  Low-fiber cereals (like cornflakes)  Dairy (if tolerated)  Ripe bananas, melon, or canned fruit without seeds  Long-Term Maintenance: High-Fiber Diet (Once Fully Recovered) This helps prevent future flare-ups by keeping the bowel movements soft and regular.  High-Fiber Foods Fruits: Apples (peeled), pears, berries, prunes  Vegetables: Broccoli, spinach, zucchini, peas  Whole grains: Oatmeal, brown rice, quinoa, whole wheat bread  Legumes: Lentils, chickpeas, black beans (start slowly to avoid gas)  Nuts & seeds: Only if tolerated (research no longer restricts them, but if you feel they cause a flare do not eat them)

## 2024-06-07 ENCOUNTER — Other Ambulatory Visit: Payer: Self-pay

## 2024-06-07 MED ORDER — AMOXICILLIN-POT CLAVULANATE 875-125 MG PO TABS: 1.0000 | ORAL_TABLET | Freq: Two times a day (BID) | ORAL | 0 refills | Status: AC

## 2024-06-07 NOTE — Telephone Encounter (Signed)
 Patient was seen yesterday for OV & was prescribed cipro  & flagyl . She took 2 doses yesterday (1 PM & 8 PM), this morning she woke up with swollen lips and a numb tongue. She has not taken any further doses this morning. She did take one dose of benadryl which has helped relieve some swelling. Denies any SOB. She does not recall taking either of these antibiotics previously, however they are on her med list from past medication prescribed. Advised her to hold off on taking further antibiotics & will give her a call back after provider has reviewed. Last seen with Alan, PA yesterday.

## 2024-06-07 NOTE — Telephone Encounter (Signed)
 Discussed PA recommendations with patient via interpreter services. Pt verbalized all understanding, prescription sent to pharmacy.

## 2024-06-29 ENCOUNTER — Other Ambulatory Visit: Payer: Self-pay | Admitting: Physician Assistant

## 2024-09-17 ENCOUNTER — Ambulatory Visit (HOSPITAL_BASED_OUTPATIENT_CLINIC_OR_DEPARTMENT_OTHER)

## 2025-02-14 ENCOUNTER — Ambulatory Visit: Admitting: Obstetrics and Gynecology
